# Patient Record
Sex: Female | Born: 1940 | Race: White | Hispanic: No | Marital: Married | Smoking: Current every day smoker
Health system: Southern US, Community
[De-identification: ages and names within clinical notes are randomized; demographics above are authoritative.]

## PROBLEM LIST (undated history)

## (undated) DIAGNOSIS — Z789 Other specified health status: Secondary | ICD-10-CM

## (undated) DIAGNOSIS — J40 Bronchitis, not specified as acute or chronic: Secondary | ICD-10-CM

## (undated) DIAGNOSIS — I499 Cardiac arrhythmia, unspecified: Secondary | ICD-10-CM

## (undated) DIAGNOSIS — T4145XA Adverse effect of unspecified anesthetic, initial encounter: Secondary | ICD-10-CM

## (undated) DIAGNOSIS — H547 Unspecified visual loss: Secondary | ICD-10-CM

## (undated) DIAGNOSIS — M1990 Unspecified osteoarthritis, unspecified site: Secondary | ICD-10-CM

## (undated) DIAGNOSIS — I639 Cerebral infarction, unspecified: Secondary | ICD-10-CM

## (undated) DIAGNOSIS — F419 Anxiety disorder, unspecified: Secondary | ICD-10-CM

## (undated) DIAGNOSIS — I10 Essential (primary) hypertension: Secondary | ICD-10-CM

## (undated) DIAGNOSIS — I70201 Unspecified atherosclerosis of native arteries of extremities, right leg: Secondary | ICD-10-CM

## (undated) HISTORY — PX: TUBAL LIGATION: SHX77

## (undated) HISTORY — DX: Other specified health status: Z78.9

## (undated) HISTORY — PX: THYROIDECTOMY, PARTIAL: SHX18

## (undated) HISTORY — PX: THYROID SURGERY: SHX805

---

## 1983-09-17 DIAGNOSIS — T8859XA Other complications of anesthesia, initial encounter: Secondary | ICD-10-CM

## 1983-09-17 HISTORY — DX: Other complications of anesthesia, initial encounter: T88.59XA

## 2019-01-12 ENCOUNTER — Ambulatory Visit (INDEPENDENT_AMBULATORY_CARE_PROVIDER_SITE_OTHER): Payer: Self-pay

## 2019-01-12 ENCOUNTER — Encounter (INDEPENDENT_AMBULATORY_CARE_PROVIDER_SITE_OTHER): Payer: Self-pay

## 2019-01-12 ENCOUNTER — Encounter (INDEPENDENT_AMBULATORY_CARE_PROVIDER_SITE_OTHER): Payer: Self-pay | Admitting: Vascular Surgery

## 2019-01-12 ENCOUNTER — Other Ambulatory Visit (INDEPENDENT_AMBULATORY_CARE_PROVIDER_SITE_OTHER): Payer: Self-pay | Admitting: Vascular Surgery

## 2019-01-12 ENCOUNTER — Ambulatory Visit (INDEPENDENT_AMBULATORY_CARE_PROVIDER_SITE_OTHER): Payer: Self-pay | Admitting: Vascular Surgery

## 2019-01-12 ENCOUNTER — Other Ambulatory Visit: Payer: Self-pay

## 2019-01-12 ENCOUNTER — Telehealth (INDEPENDENT_AMBULATORY_CARE_PROVIDER_SITE_OTHER): Payer: Self-pay

## 2019-01-12 VITALS — BP 159/91 | HR 90 | Resp 16 | Ht 63.0 in | Wt 172.0 lb

## 2019-01-12 DIAGNOSIS — I70269 Atherosclerosis of native arteries of extremities with gangrene, unspecified extremity: Secondary | ICD-10-CM | POA: Insufficient documentation

## 2019-01-12 DIAGNOSIS — I739 Peripheral vascular disease, unspecified: Secondary | ICD-10-CM

## 2019-01-12 DIAGNOSIS — R030 Elevated blood-pressure reading, without diagnosis of hypertension: Secondary | ICD-10-CM

## 2019-01-12 DIAGNOSIS — F17200 Nicotine dependence, unspecified, uncomplicated: Secondary | ICD-10-CM | POA: Insufficient documentation

## 2019-01-12 DIAGNOSIS — I70261 Atherosclerosis of native arteries of extremities with gangrene, right leg: Secondary | ICD-10-CM

## 2019-01-12 NOTE — Assessment & Plan Note (Signed)
Her blood pressure is significantly elevated today.  There may be a component of pain causing some of the rise in blood pressure.  I suspect she has some underlying hypertension which is undiagnosed as she has not seen a doctor in many years.

## 2019-01-12 NOTE — Assessment & Plan Note (Signed)
The patient has advanced gangrenous changes to the forefoot on the right foot.  Noninvasive studies today demonstrate a markedly reduced right ABI of 0.52 with monophasic waveforms.  Her left ABI is mildly diminished at 0.90 with monophasic waveforms. This is clearly a critical and limb threatening situation.  I am going to go ahead and start her on some antibiotics and give her some pain pills as this is clearly very painful.  At this point, realistically I think our best hope would be a transmetatarsal amputation but she is at very high risk of major more proximal amputation.  She has extensive vascular disease and revascularization will be required for any hope of limb salvage.  I discussed with she and her husband and we will schedule an angiogram in the very near future at their convenience.  I have discussed the risks and benefits of that procedure and the reason and rationale for performing it.  They are agreeable to proceed.

## 2019-01-12 NOTE — Assessment & Plan Note (Signed)
This is an underlying risk for vascular disease.  No interest in quitting.

## 2019-01-12 NOTE — Patient Instructions (Signed)

## 2019-01-12 NOTE — Progress Notes (Signed)
Patient ID: Krystal Herrera, female   DOB: 28-Jun-1941, 78 y.o.   MRN: [id number]  Chief Complaint  Patient presents with  . New Patient (Initial Visit)    HPI Krystal Herrera is a 78 y.o. female.  Patient present for evaluation of pain and skin changes to the right foot.  Over the past several weeks, the patient has had increasing pain and skin breakdown on her right forefoot.  Her first 3 toes on the right foot are black and clearly gangrenous.  The fourth and fifth toes have some early gangrenous changes as well.  The top of the foot has dark eschar and some fibrinous exudate up to the metatarsal head area.  The plantar aspect of the foot appears viable to near the toe bases.  The foot is swollen.  There is a significant odor.  The wound has some mild surrounding erythema.  The left leg is swollen but there are no open ulcerations on that leg.  The left leg is not nearly as painful.  She has not been to a doctor in over 20 years so she does not have a lot of medical history although she is legally blind and her husband says she has had some strokes as well.  He has been caring for her.  Noninvasive studies today demonstrate a markedly reduced right ABI of 0.52 with monophasic waveforms.  Her left ABI is mildly diminished at 0.90 with monophasic waveforms.  Current Outpatient Medications  Medication Sig Dispense Refill  . aspirin EC 81 MG tablet Take 81 mg by mouth daily.     No current facility-administered medications for this visit.      PMH Patient has not gone to a doctor in over 20 years. Her husband reports that she has likely had some small strokes.  She is legally blind.  Family History No bleeding disorders, clotting disorders, autoimmune diseases, or aneurysms  Social History Married Retired Positive for tobacco use No illicit drug use  NKDA     REVIEW OF SYSTEMS (Negative unless checked)  Constitutional: [] Weight loss  [] Fever  [] Chills Cardiac: [] Chest pain    [] Chest pressure   [] Palpitations   [] Shortness of breath when laying flat   [] Shortness of breath at rest   [] Shortness of breath with exertion. Vascular:  [x] Pain in legs with walking   [] Pain in legs at rest   [] Pain in legs when laying flat   [] Claudication   [x] Pain in feet when walking  [x] Pain in feet at rest  [] Pain in feet when laying flat   [] History of DVT   [] Phlebitis   [] Swelling in legs   [] Varicose veins   [x] Non-healing ulcers Pulmonary:   [] Uses home oxygen   [] Productive cough   [] Hemoptysis   [] Wheeze  [] COPD   [] Asthma Neurologic:  [] Dizziness  [] Blackouts   [] Seizures   [x] History of stroke   [] History of TIA  [] Aphasia   [] Temporary blindness   [] Dysphagia   [] Weakness or numbness in arms   [] Weakness or numbness in legs Musculoskeletal:  [] Arthritis   [] Joint swelling   [] Joint pain   [] Low back pain Hematologic:  [] Easy bruising  [] Easy bleeding   [] Hypercoagulable state   [] Anemic  [] Hepatitis  Gastrointestinal:  [] Blood in stool   [] Vomiting blood  [] Gastroesophageal reflux/heartburn   [] Difficulty swallowing   Genitourinary:  [] Chronic kidney disease   [] Difficult urination  [] Frequent urination  [] Burning with urination   [] Blood in urine Skin:  [] Rashes   [x] Ulcers   [  x]Wounds Psychological:  [] History of anxiety   []  History of major depression.  Physical Exam BP (!) 159/91 (BP Location: Right Arm)   Pulse 90   Resp 16   Ht 5\' 3"  (1.6 m)   Wt 172 lb (78 kg)   BMI 30.47 kg/m   Gen:  WD/WN, NAD Head: [place]/AT, No temporalis wasting.  Ear/Nose/Throat: Hearing grossly intact, nares w/o erythema or drainage, oropharynx w/o Erythema/Exudate Eyes: Sclera non-icteric, conjunctiva clear. Visual acuity very poor. Neck: Trachea midline.  No JVD.  Pulmonary:  Good air movement, no use of accessory muscles.  Cardiac: tachycardic Vascular:  Vessel Right Left  Radial Palpable Palpable                          PT Not Palpable Not Palpable  DP Not Palpable 1+ Palpable     Musculoskeletal: M/S 5/5 throughout.  1-2+ right lower extremity edema.  2+ left lower extremity edema.  Right forefoot with gangrenous changes as below. Neurologic:  Sensation grossly intact in extremities.  Symmetrical.  Speech is fluent. Motor exam as listed above. Psychiatric: Judgment intact, Mood & affect appropriate for pt's clinical situation. Dermatologic: Right foot wound with gangrenous changes to the toes worse in toes 1 2 and 3.  Gangrenous and necrotic changes extend to the top of the foot with mild to moderate surrounding erythema.  The wound is somewhat odorous.  No left leg wounds.  Radiology No results found.  Labs No results found for this or any previous visit (from the past 2160 hour(s)).  Assessment/Plan:  Tobacco use disorder This is an underlying risk for vascular disease.  No interest in quitting.  Elevated BP without diagnosis of hypertension Her blood pressure is significantly elevated today.  There may be a component of pain causing some of the rise in blood pressure.  I suspect she has some underlying hypertension which is undiagnosed as she has not seen a doctor in many years.  Atherosclerosis of native arteries of the extremities with gangrene [facility](HCC) The patient has advanced gangrenous changes to the forefoot on the right foot.  Noninvasive studies today demonstrate a markedly reduced right ABI of 0.52 with monophasic waveforms.  Her left ABI is mildly diminished at 0.90 with monophasic waveforms. This is clearly a critical and limb threatening situation.  I am going to go ahead and start her on some antibiotics and give her some pain pills as this is clearly very painful.  At this point, realistically I think our best hope would be a transmetatarsal amputation but she is at very high risk of major more proximal amputation.  She has extensive vascular disease and revascularization will be required for any hope of limb salvage.  I discussed with she and her  husband and we will schedule an angiogram in the very near future at their convenience.  I have discussed the risks and benefits of that procedure and the reason and rationale for performing it.  They are agreeable to proceed.     Festus BarrenJason Daphnee Preiss 01/12/2019, 1:04 PM   This note was created with [facility] medical dictation system.  Any errors from dictation are unintentional.

## 2019-01-12 NOTE — Telephone Encounter (Signed)
Attempted to contact the patient to schedule a right leg angio, a message was left for a return call.

## 2019-01-21 ENCOUNTER — Ambulatory Visit: Payer: Medicare Other

## 2019-01-21 ENCOUNTER — Other Ambulatory Visit (INDEPENDENT_AMBULATORY_CARE_PROVIDER_SITE_OTHER): Payer: Self-pay | Admitting: Vascular Surgery

## 2019-01-21 ENCOUNTER — Ambulatory Visit (HOSPITAL_COMMUNITY)
Admission: RE | Admit: 2019-01-21 | Discharge: 2019-01-21 | Disposition: A | Discharge status: Home / Self Care | Payer: Medicare Other | Source: Home / Self Care | Attending: Physician Assistant | Admitting: Physician Assistant

## 2019-01-21 ENCOUNTER — Other Ambulatory Visit: Payer: Self-pay

## 2019-01-21 ENCOUNTER — Inpatient Hospital Stay
Admission: RE | Admit: 2019-01-21 | Discharge: 2019-01-23 | DRG: 253 | Disposition: A | Discharge status: Home Health | Payer: Medicare Other | Attending: Family Medicine | Admitting: Family Medicine

## 2019-01-21 DIAGNOSIS — I701 Atherosclerosis of renal artery: Secondary | ICD-10-CM | POA: Diagnosis present

## 2019-01-21 DIAGNOSIS — F17210 Nicotine dependence, cigarettes, uncomplicated: Secondary | ICD-10-CM | POA: Diagnosis present

## 2019-01-21 DIAGNOSIS — I499 Cardiac arrhythmia, unspecified: Secondary | ICD-10-CM

## 2019-01-21 DIAGNOSIS — J449 Chronic obstructive pulmonary disease, unspecified: Secondary | ICD-10-CM | POA: Diagnosis present

## 2019-01-21 DIAGNOSIS — I10 Essential (primary) hypertension: Secondary | ICD-10-CM | POA: Diagnosis present

## 2019-01-21 DIAGNOSIS — Z1159 Encounter for screening for other viral diseases: Secondary | ICD-10-CM

## 2019-01-21 DIAGNOSIS — I480 Paroxysmal atrial fibrillation: Secondary | ICD-10-CM | POA: Diagnosis present

## 2019-01-21 DIAGNOSIS — L97919 Non-pressure chronic ulcer of unspecified part of right lower leg with unspecified severity: Secondary | ICD-10-CM | POA: Diagnosis present

## 2019-01-21 DIAGNOSIS — Z8249 Family history of ischemic heart disease and other diseases of the circulatory system: Secondary | ICD-10-CM | POA: Diagnosis not present

## 2019-01-21 DIAGNOSIS — Z8673 Personal history of transient ischemic attack (TIA), and cerebral infarction without residual deficits: Secondary | ICD-10-CM | POA: Diagnosis not present

## 2019-01-21 DIAGNOSIS — H548 Legal blindness, as defined in USA: Secondary | ICD-10-CM | POA: Diagnosis present

## 2019-01-21 DIAGNOSIS — Z885 Allergy status to narcotic agent status: Secondary | ICD-10-CM | POA: Diagnosis not present

## 2019-01-21 DIAGNOSIS — R296 Repeated falls: Secondary | ICD-10-CM | POA: Diagnosis present

## 2019-01-21 DIAGNOSIS — Z66 Do not resuscitate: Secondary | ICD-10-CM | POA: Diagnosis present

## 2019-01-21 DIAGNOSIS — I70201 Unspecified atherosclerosis of native arteries of extremities, right leg: Secondary | ICD-10-CM | POA: Diagnosis present

## 2019-01-21 DIAGNOSIS — Z7982 Long term (current) use of aspirin: Secondary | ICD-10-CM | POA: Diagnosis not present

## 2019-01-21 DIAGNOSIS — I70269 Atherosclerosis of native arteries of extremities with gangrene, unspecified extremity: Secondary | ICD-10-CM

## 2019-01-21 DIAGNOSIS — I340 Nonrheumatic mitral (valve) insufficiency: Secondary | ICD-10-CM

## 2019-01-21 DIAGNOSIS — I4891 Unspecified atrial fibrillation: Secondary | ICD-10-CM | POA: Diagnosis present

## 2019-01-21 DIAGNOSIS — I96 Gangrene, not elsewhere classified: Secondary | ICD-10-CM | POA: Diagnosis present

## 2019-01-21 HISTORY — DX: Essential (primary) hypertension: I10

## 2019-01-21 HISTORY — DX: Adverse effect of unspecified anesthetic, initial encounter: T41.45XA

## 2019-01-21 HISTORY — DX: Cardiac arrhythmia, unspecified: I49.9

## 2019-01-21 HISTORY — DX: Cerebral infarction, unspecified: I63.9

## 2019-01-21 LAB — ECHOCARDIOGRAM COMPLETE
Height: 63 in
Weight: 2751.34 oz

## 2019-01-21 LAB — CBC
HCT: 36.3 % (ref 36.0–46.0)
Hemoglobin: 11.5 g/dL — ABNORMAL LOW (ref 12.0–15.0)
MCH: 31.3 pg (ref 26.0–34.0)
MCHC: 31.7 g/dL (ref 30.0–36.0)
MCV: 98.6 fL (ref 80.0–100.0)
Platelets: 364 10*3/uL (ref 150–400)
RBC: 3.68 MIL/uL — ABNORMAL LOW (ref 3.87–5.11)
RDW: 15.2 % (ref 11.5–15.5)
WBC: 11.4 10*3/uL — ABNORMAL HIGH (ref 4.0–10.5)
nRBC: 0 % (ref 0.0–0.2)

## 2019-01-21 LAB — BASIC METABOLIC PANEL
Anion gap: 14 (ref 5–15)
BUN: 10 mg/dL (ref 8–23)
CO2: 22 mmol/L (ref 22–32)
Calcium: 8.6 mg/dL — ABNORMAL LOW (ref 8.9–10.3)
Chloride: 105 mmol/L (ref 98–111)
Creatinine, Ser: 0.71 mg/dL (ref 0.44–1.00)
GFR calc Af Amer: >60 mL/min (ref >60)
GFR calc non Af Amer: >60 mL/min (ref >60)
Glucose, Bld: 121 mg/dL — ABNORMAL HIGH (ref 70–99)
Potassium: 4.1 mmol/L (ref 3.5–5.1)
Sodium: 141 mmol/L (ref 135–145)

## 2019-01-21 LAB — BUN: BUN: 11 mg/dL (ref 8–23)

## 2019-01-21 LAB — CREATININE, SERUM
Creatinine, Ser: 0.81 mg/dL (ref 0.44–1.00)
GFR calc Af Amer: >60 mL/min (ref >60)
GFR calc non Af Amer: >60 mL/min (ref >60)

## 2019-01-21 LAB — TSH: TSH: 7.459 u[IU]/mL — ABNORMAL HIGH (ref 0.350–4.500)

## 2019-01-21 LAB — HEMOGLOBIN A1C
Hgb A1c MFr Bld: 5.6 % (ref 4.8–5.6)
Mean Plasma Glucose: 114.02 mg/dL

## 2019-01-21 LAB — TROPONIN I
Troponin I: <0.03 ng/mL (ref <0.03)
Troponin I: <0.03 ng/mL (ref <0.03)
Troponin I: <0.03 ng/mL (ref <0.03)
Troponin I: <0.03 ng/mL (ref <0.03)

## 2019-01-21 LAB — SARS CORONAVIRUS 2 BY RT PCR (HOSPITAL ORDER, PERFORMED IN ~~LOC~~ HOSPITAL LAB): SARS Coronavirus 2: NEGATIVE

## 2019-01-21 LAB — MAGNESIUM: Magnesium: 1.6 mg/dL — ABNORMAL LOW (ref 1.7–2.4)

## 2019-01-21 LAB — HEPARIN LEVEL (UNFRACTIONATED): Heparin Unfractionated: 0.29 IU/mL — ABNORMAL LOW (ref 0.30–0.70)

## 2019-01-21 MED ORDER — HEPARIN (PORCINE) 25000 UT/250ML-% IV SOLN
INTRAVENOUS | Status: AC
  Administered 2019-01-21: 3400 [IU] via INTRAVENOUS
  Filled 2019-01-21: qty 250

## 2019-01-21 MED ORDER — SODIUM CHLORIDE 0.9 % IV SOLN
INTRAVENOUS | Status: DC
  Administered 2019-01-22: 02:00:00 via INTRAVENOUS

## 2019-01-21 MED ORDER — ONDANSETRON HCL 4 MG/2ML IJ SOLN: 4.0000 mg | Freq: Four times a day (QID) | INTRAMUSCULAR | Status: DC | PRN

## 2019-01-21 MED ORDER — KETOROLAC TROMETHAMINE 30 MG/ML IJ SOLN
30.0000 mg | Freq: Once | INTRAMUSCULAR | Status: AC
  Filled 2019-01-21: qty 1

## 2019-01-21 MED ORDER — DILTIAZEM HCL 100 MG IV SOLR
5.0000 mg/h | INTRAVENOUS | Status: DC
  Administered 2019-01-21: 15 mg/h via INTRAVENOUS
  Administered 2019-01-21: 5 mg/h via INTRAVENOUS
  Administered 2019-01-22 (×3): 12.5 mg/h via INTRAVENOUS
  Administered 2019-01-22: 10 mg/h via INTRAVENOUS
  Filled 2019-01-21 (×5): qty 100

## 2019-01-21 MED ORDER — HYDRALAZINE HCL 20 MG/ML IJ SOLN: 10.0000 mg | Freq: Four times a day (QID) | INTRAMUSCULAR | Status: DC | PRN

## 2019-01-21 MED ORDER — METHYLPREDNISOLONE SODIUM SUCC 125 MG IJ SOLR: 125.0000 mg | Freq: Once | INTRAMUSCULAR | Status: DC | PRN

## 2019-01-21 MED ORDER — ACETAMINOPHEN 325 MG PO TABS: 650.0000 mg | ORAL_TABLET | Freq: Four times a day (QID) | ORAL | Status: DC | PRN

## 2019-01-21 MED ORDER — DILTIAZEM LOAD VIA INFUSION
10.0000 mg | Freq: Once | INTRAVENOUS | Status: DC
  Filled 2019-01-21: qty 10

## 2019-01-21 MED ORDER — DILTIAZEM HCL-DEXTROSE 100-5 MG/100ML-% IV SOLN (PREMIX)
5.0000 mg/h | INTRAVENOUS | Status: DC
  Filled 2019-01-21: qty 100

## 2019-01-21 MED ORDER — DILTIAZEM LOAD VIA INFUSION
10.0000 mg | Freq: Once | INTRAVENOUS | Status: AC
  Administered 2019-01-21: 10 mg via INTRAVENOUS
  Filled 2019-01-21: qty 10

## 2019-01-21 MED ORDER — SODIUM CHLORIDE 0.9 % IV SOLN
INTRAVENOUS | Status: DC
  Administered 2019-01-21: 1000 mL via INTRAVENOUS

## 2019-01-21 MED ORDER — TRAMADOL HCL 50 MG PO TABS
50.0000 mg | ORAL_TABLET | Freq: Four times a day (QID) | ORAL | Status: DC | PRN
  Administered 2019-01-21 – 2019-01-22 (×3): 50 mg via ORAL
  Filled 2019-01-21 (×2): qty 1

## 2019-01-21 MED ORDER — DILTIAZEM LOAD VIA INFUSION
10.0000 mg | Freq: Once | INTRAVENOUS | Status: DC
  Filled 2019-01-21 (×2): qty 10

## 2019-01-21 MED ORDER — NICOTINE 21 MG/24HR TD PT24
21.0000 mg | MEDICATED_PATCH | Freq: Every day | TRANSDERMAL | Status: DC
  Filled 2019-01-21: qty 1

## 2019-01-21 MED ORDER — ONDANSETRON HCL 4 MG PO TABS: 4.0000 mg | ORAL_TABLET | Freq: Four times a day (QID) | ORAL | Status: DC | PRN

## 2019-01-21 MED ORDER — HYDROMORPHONE HCL 1 MG/ML IJ SOLN: 1.0000 mg | Freq: Once | INTRAMUSCULAR | Status: DC | PRN

## 2019-01-21 MED ORDER — MAGNESIUM SULFATE 2 GM/50ML IV SOLN
2.0000 g | Freq: Once | INTRAVENOUS | Status: AC
  Administered 2019-01-21: 2 g via INTRAVENOUS
  Filled 2019-01-21: qty 50

## 2019-01-21 MED ORDER — FAMOTIDINE 20 MG PO TABS: 40.0000 mg | ORAL_TABLET | Freq: Once | ORAL | Status: DC | PRN

## 2019-01-21 MED ORDER — DIPHENHYDRAMINE HCL 50 MG/ML IJ SOLN: 50.0000 mg | Freq: Once | INTRAMUSCULAR | Status: DC | PRN

## 2019-01-21 MED ORDER — CEFAZOLIN SODIUM-DEXTROSE 2-4 GM/100ML-% IV SOLN
2.0000 g | Freq: Once | INTRAVENOUS | Status: AC
  Administered 2019-01-22: 2 g via INTRAVENOUS

## 2019-01-21 MED ORDER — CEFAZOLIN SODIUM-DEXTROSE 2-4 GM/100ML-% IV SOLN
INTRAVENOUS | Status: AC
  Administered 2019-01-21: 2 g via INTRAVENOUS
  Filled 2019-01-21: qty 100

## 2019-01-21 MED ORDER — MIDAZOLAM HCL 2 MG/ML PO SYRP: 8.0000 mg | ORAL_SOLUTION | Freq: Once | ORAL | Status: DC | PRN

## 2019-01-21 MED ORDER — HEPARIN (PORCINE) 25000 UT/250ML-% IV SOLN
1050.0000 [IU]/h | INTRAVENOUS | Status: DC
  Administered 2019-01-21: 950 [IU]/h via INTRAVENOUS

## 2019-01-21 MED ORDER — HEPARIN BOLUS VIA INFUSION
3400.0000 [IU] | Freq: Once | INTRAVENOUS | Status: AC
  Administered 2019-01-21: 15:00:00 3400 [IU] via INTRAVENOUS

## 2019-01-21 MED ORDER — KETOROLAC TROMETHAMINE 60 MG/2ML IM SOLN
INTRAMUSCULAR | Status: AC
  Administered 2019-01-21: 30 mg via INTRAVENOUS
  Filled 2019-01-21: qty 2

## 2019-01-21 MED ORDER — ACETAMINOPHEN 650 MG RE SUPP: 650.0000 mg | Freq: Four times a day (QID) | RECTAL | Status: DC | PRN

## 2019-01-21 NOTE — Consult Note (Signed)
ANTICOAGULATION CONSULT NOTE - Initial Consult  Pharmacy Consult for Heparin  Indication: atrial fibrillation  Allergies  Allergen Reactions  . Hydrocodone-Acetaminophen Swelling    Itching to arms and swelling to face and lips, improved with benadryl.    Patient Measurements: Height: 5\' 3"  (160 cm) Weight: 171 lb 15.3 oz (78 kg) IBW/kg (Calculated) : 52.4 Heparin Dosing Weight: 69.2 kg  Vital Signs: Temp: 97.8 F (36.6 C) (05/07 1158) Temp Source: Oral (05/07 1158) BP: 169/106 (05/07 1158) Pulse Rate: 167 (05/07 1158)  Labs: Recent Labs    01/21/19 1134 01/21/19 1203  HGB  --  11.5*  HCT  --  36.3  PLT  --  364  CREATININE 0.81 0.71  TROPONINI  --  <0.03    Estimated Creatinine Clearance: 58.2 mL/min (by C-G formula based on SCr of 0.71 mg/dL).   Medical History: Past Medical History:  Diagnosis Date  . Complication of anesthesia 1985   bp, hr and breathing bottomed out during surgery  . Dysrhythmia 01/21/2019  . Hypertension   . No pertinent past medical history    patient stated that she has not seen a doctor in 20 years  . Stroke [facility])     Medications:  Medications Prior to Admission  Medication Sig Dispense Refill Last Dose  . aspirin EC 81 MG tablet Take 81 mg by mouth daily.   Taking   Scheduled:   Infusions:  . sodium chloride 1,000 mL (01/21/19 1224)  . ceFAZolin    .  ceFAZolin (ANCEF) IV    . diltiazem (CARDIZEM) infusion 5 mg/hr (01/21/19 1331)  . magnesium sulfate bolus IVPB     PRN: diphenhydrAMINE, famotidine, HYDROmorphone (DILAUDID) injection, methylPREDNISolone (SOLU-MEDROL) injection, midazolam, ondansetron (ZOFRAN) IV Anti-infectives (From admission, onward)   Start     Dose/Rate Route Frequency Ordered Stop   01/21/19 1115  ceFAZolin (ANCEF) IVPB 2g/100 mL premix     2 g 200 mL/hr over 30 Minutes Intravenous  Once 01/21/19 1106     01/21/19 1111  ceFAZolin (ANCEF) 2-4 GM/100ML-% IVPB    Note to Pharmacy:  Littie Deeds   : cabinet override      01/21/19 1111 01/21/19 2314      Assessment: Pharmacy was consulted to start heparin for afib. Not on PTA anticoagulation. CHA2DS2VASc score of at least 7 (HTN, agex2, strokex2, vascular, female) and pending labs and echo for further risk stratification.   Goal of Therapy:  Heparin level 0.3-0.7 units/ml Monitor platelets by anticoagulation protocol: Yes   Plan:  Give 3400 units bolus x 1 Start heparin infusion at 950 units/hr Check anti-Xa level in 8 hours and daily while on heparin Continue to monitor H&H and platelets  Ronnald Ramp, PharmD, BCPS 01/21/2019,1:45 PM

## 2019-01-21 NOTE — H&P (Signed)
[facility] History & Physical Update  The patient was interviewed and re-examined.  The patient's previous History and Physical has been reviewed. She is in atrial fibrillation with RVR and a HR of 150.  Will ask Cardiology to see before considering proceeding with the procedure.   Festus Barren, MD  01/21/2019, 11:28 AM

## 2019-01-21 NOTE — Progress Notes (Signed)
*  PRELIMINARY RESULTS* Echocardiogram 2D Echocardiogram has been performed.  Cristela Blue 01/21/2019, 1:39 PM

## 2019-01-21 NOTE — Consult Note (Signed)
ANTICOAGULATION CONSULT NOTE - Initial Consult  Pharmacy Consult for Heparin  Indication: atrial fibrillation  Allergies  Allergen Reactions  . Hydrocodone-Acetaminophen Swelling    Itching to arms and swelling to face and lips, improved with benadryl.    Patient Measurements: Height: 5\' 4"  (162.6 cm) Weight: 180 lb 4.8 oz (81.8 kg) IBW/kg (Calculated) : 54.7 Heparin Dosing Weight: 69.2 kg  Vital Signs: Temp: 98.6 F (37 C) (05/07 2259) Temp Source: Oral (05/07 2259) BP: 119/73 (05/07 2330) Pulse Rate: 76 (05/07 2330)  Labs: Recent Labs    01/21/19 1134  01/21/19 1203 01/21/19 1556 01/21/19 1931 01/21/19 2314  HGB  --   --  11.5*  --   --   --   HCT  --   --  36.3  --   --   --   PLT  --   --  364  --   --   --   HEPARINUNFRC  --   --   --   --   --  0.29*  CREATININE 0.81  --  0.71  --   --   --   TROPONINI  --    < > <0.03 <0.03 <0.03 <0.03   < > = values in this interval not displayed.    Estimated Creatinine Clearance: 60.9 mL/min (by C-G formula based on SCr of 0.71 mg/dL).   Medical History: Past Medical History:  Diagnosis Date  . Complication of anesthesia 1985   bp, hr and breathing bottomed out during surgery  . Dysrhythmia 01/21/2019  . Hypertension   . No pertinent past medical history    patient stated that she has not seen a doctor in 20 years  . Stroke [facility])     Medications:  Medications Prior to Admission  Medication Sig Dispense Refill Last Dose  . aspirin EC 81 MG tablet Take 81 mg by mouth daily.   Taking   Scheduled:  . nicotine  21 mg Transdermal Daily   Infusions:  . [START ON 01/22/2019] sodium chloride    .  ceFAZolin (ANCEF) IV    . diltiazem (CARDIZEM) infusion 15 mg/hr (01/21/19 1850)  . heparin 950 Units/hr (01/21/19 1442)   PRN: acetaminophen **OR** acetaminophen, hydrALAZINE, HYDROmorphone (DILAUDID) injection, ondansetron (ZOFRAN) IV, ondansetron **OR** ondansetron (ZOFRAN) IV, traMADol Anti-infectives (From  admission, onward)   Start     Dose/Rate Route Frequency Ordered Stop   01/21/19 1115  ceFAZolin (ANCEF) IVPB 2g/100 mL premix     2 g 200 mL/hr over 30 Minutes Intravenous  Once 01/21/19 1106     01/21/19 1111  ceFAZolin (ANCEF) 2-4 GM/100ML-% IVPB    Note to Pharmacy:  Littie Deeds  : cabinet override      01/21/19 1111 01/21/19 1538      Assessment: Pharmacy was consulted to start heparin for afib. Not on PTA anticoagulation. CHA2DS2VASc score of at least 7 (HTN, agex2, strokex2, vascular, female) and pending labs and echo for further risk stratification.   Goal of Therapy:  Heparin level 0.3-0.7 units/ml Monitor platelets by anticoagulation protocol: Yes   Plan:  05/07 @ 2300 HL 0.29 borderline subtherapeutic. Will increase rate to 1050 units/hr and will recheck HL @ 0800, CBC stable will continue to monitor.  Thomasene Ripple, PharmD, BCPS 01/21/2019,11:43 PM

## 2019-01-21 NOTE — Consult Note (Signed)
Cardiology Consultation:   Patient ID: Krystal Herrera MRN: [id number]; DOB: Jan 27, 1941  Admit date: 01/21/2019 Date of Consult: 01/21/2019  Primary Care Provider: Patient, No Pcp Per Primary Cardiologist: New to [place], Dr. Mariah Milling Primary Electrophysiologist:  None   Patient Profile:   Krystal Herrera is a 78 y.o. 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 Wyn Quaker.  History of Present Illness:   Krystal Herrera 78 year old female with medical history as above.  No reported past cardiac history. Unfortunately, she is not a great historian and has demonstrated poor medical compliance in the past.  She has not seen a physician in over 20 years. Medical history includes a possible partial thyroidectomy in 1985, reported by the patient, and for which she may have taken Synthroid for but believes this was stopped per her physician recommendation many years ago.  TSH lab pending.  Per review of EMR, she a;sp has several documented elevated blood pressures without an official diagnosis of hypertension.  Patient is not aware of any history of hypertension.  She does smoke 1 pack daily with a long history of smoking and no desire to quit.  She also admits to daily alcohol use with 1 drink per day and including fireball and various  other mixed drinks / hard liquor. To her knowledge, she has never seen a cardiologist and denied being told she had an irregular heart rhythm or Afib in the past.  She is unaware of any family history of atrial fibrillation.  She does admit to frequent falls with the most recent fall reported this past Sunday 01/17/2019. She has not hit her head during any of these falls.  Per her husband, patient also has a past history of strokes, not documented in any available EMR at this time.  Patient does not endorse any neurologic deficit s/sx, and feels that the falls are due to lower extremity weakness rather than a possible h/o stroke, arrhythmia, or vision problems as she is legally blind.   On 01/21/2019, she presented to [facility] for lower extremity angiography of her  right lower extremity due to peripheral vascular disease and right LE gangrene with future possible revascularization of her right lower extremity/foot and possible transmetatarsal amputation or even more proximal amputation, given her extensive vascular disease.  Cardiology was consulted as the patient was found to be in Afib with RVR and rates up to 160s on telemetry prior to her procedure.  On examination, patient denied chest pain, palpitations, or feeling of racing heart rate while in atrial fibrillation.  She denied any shortness of breath but did note LE edema, as well as pain associated with RLE gangrene. She did not feel as if she had been in this rhythm in the past. She denied PTA medications for anticoagulation or heart rate control.  She reported that she was unable to provide any further information regarding current symptoms, as she can only focus on the pain  associated with the gangrene of her right lower extremity.   Past Medical History:  Diagnosis Date   Complication of anesthesia 1985   bp, hr and breathing bottomed out during surgery   Dysrhythmia 01/21/2019   Hypertension    No pertinent past medical history    patient stated  that she has not seen a doctor in 20 years   Stroke [facility])     No past cardiac medical history  Home Medications:  Prior to Admission medications   Medication Sig Start Date End Date Taking? Authorizing Provider  aspirin EC 81 MG tablet Take 81 mg by mouth daily.    [provider]    Inpatient Medications: Scheduled Meds:  diltiazem  10 mg Intravenous Once   Continuous Infusions:  sodium chloride     ceFAZolin      ceFAZolin (ANCEF) IV     PRN Meds: diphenhydrAMINE, famotidine, HYDROmorphone (DILAUDID) injection, methylPREDNISolone (SOLU-MEDROL) injection, midazolam, ondansetron (ZOFRAN) IV  Allergies:    Allergies  Allergen Reactions   Hydrocodone-Acetaminophen Swelling    Itching to arms and swelling to face and lips, improved with benadryl.    Social History:   Social History   Socioeconomic History   Marital status: Married    Spouse name: Not on file   Number of children: Not on file   Years of education: Not on file   Highest education level: Not on file  Occupational History   Not on file  Social Needs   Financial resource strain: Not on file   Food insecurity:    Worry: Not on file    Inability: Not on file   Transportation needs:    Medical: Not on file    Non-medical: Not on file  Tobacco Use   Smoking status: Current Every Day Smoker   Smokeless tobacco: Never Used  Substance and Sexual Activity   Alcohol use: Yes    Alcohol/week: 1.0 standard drinks    Types: 1 Standard drinks or equivalent per week    Comment: daily   Drug use: Never   Sexual activity: Not on file  Lifestyle   Physical activity:    Days per week: Not on file    Minutes per session: Not on file   Stress: Not on file  Relationships   Social connections:    Talks on phone: Not on file    Gets together: Not on file    Attends religious service: Not on file    Active member of club or organization: Not on file    Attends meetings of clubs or  organizations: Not on file    Relationship status: Not on file   Intimate partner violence:    Fear of current or ex partner: Not on file    Emotionally abused: Not on file    Physically abused: Not on file    Forced sexual activity: Not on file  Other Topics Concern   Not on file  Social History Narrative   Not on file    Family History:   No family history of heart disease or arrhythmia. Family History  Family history unknown: Yes     ROS:  Please see the history of present illness.  Review of Systems  Unable to perform ROS: Medical condition  Constitutional: Negative for malaise/fatigue.  Eyes:       Legally blind  Respiratory: Negative for cough, hemoptysis, shortness of breath and wheezing.   Cardiovascular: Negative for chest pain, palpitations, orthopnea and PND.  Genitourinary: Negative for hematuria.  Musculoskeletal: Positive for falls.       Stated her RLE pain from gangrene wound trumps her ability to provide accurate ROS  Neurological: Negative for dizziness, focal weakness, loss of consciousness and headaches.       Tremor noted on exam but not noted by patient on ROS  All other systems reviewed and are negative.   All other ROS reviewed and negative.     Physical Exam/Data:   Vitals:   01/21/19 1158  BP: (!) 169/106  Pulse: (!) 167  Resp: (!) 21  Temp: 97.8 F (36.6 C)  TempSrc: Oral  SpO2: 96%  Weight: 78 kg  Height: 5\' 3"  (1.6 m)   No intake or output data in the 24 hours ending 01/21/19 1212 Filed Weights   01/21/19 1158  Weight: 78 kg   Body mass index is 30.46 kg/m.  General: Obese female in no acute distress HEENT: normal Neck: no JVD Vascular: Right lower extremity wound/gangrene due to peripheral vascular disease Cardiac: IR IR; no murmur appreciated at auscultation Lungs:  clear to auscultation bilaterally, slight bilateral wheezing, no rhonchi or rales  Abd: obese, soft, nontender, no hepatomegaly  Ext: Slight bilateral lower  extremity edema in setting of gangrene of right lower extremity Musculoskeletal: Right lower extremity gangrene-BLE strength not assessed Skin: warm and dry, RLE gangrene  Neuro:  CNs 2-12 intact, no focal abnormalities noted Psych:  Normal affect   EKG:  The EKG was personally reviewed and demonstrates: Atrial fibrillation with rapid ventricular rate, 162bpm, Low voltage in the precordial leads,QTc 478, QRS 77  Telemetry:  Telemetry was personally reviewed and demonstrates:  IRIR, rates 150-160s while at rest  Relevant CV Studies: Pending echo  Laboratory Data:  Chemistry Recent Labs  Lab 01/21/19 1134  BUN 11  CREATININE 0.81  GFRNONAA >60  GFRAA >60    No results for input(s): PROT, ALBUMIN, AST, ALT, ALKPHOS, BILITOT in the last 168 hours. HematologyNo results for input(s): WBC, RBC, HGB, HCT, MCV, MCH, MCHC, RDW, PLT in the last 168 hours. Cardiac EnzymesNo results for input(s): TROPONINI in the last 168 hours. No results for input(s): TROPIPOC in the last 168 hours.  BNPNo results for input(s): BNP, PROBNP in the last 168 hours.  DDimer No results for input(s): DDIMER in the last 168 hours.  Radiology/Studies:  No results found.  Assessment and Plan:   Paroxysmal Atrial fibrillation with RVR -Asymptomatic.  EKG as above shows Afib with rapid ventricular rate prior to vascular procedure. Vascular procedure on hold and patient to be admitted. -Unclear if new in onset, and patient is not the best historian and asymptomatic when in this rhythm.  Given her vascular problems and that her husband has reported a history of stroke, it is possible that the patient has been in this rhythm before and that this is new in onset. -RVR with current rate ventricular rate into the 160s.  Continue to monitor. - Started on IV diltiazem with bolus of 10 mg.  Repeat bolus as needed as BP tolerates.  Recommend infusion of diltiazem for optimal heart rate control of under 100 bpm.  - Started on  heparin drip. Continue.  PTA medications included ASA 81 mg.  Continue. - Not on PTA anticoagulation. CHA2DS2VASc score of at least 7 (HTN, agex2, strokex2, vascular, female) and pending labs and echo for further risk stratification. - No plan for cardioversion at this time and given patient is not therapeutically anticoagulated. If does not convert  on diltiazem drip, could consider TEE/DCCV later in admission.  -Frequent falls, confirmed by patient with last fall 5/3.  Will need to consider her history of frequent falls with any initiation of anticoagulation in the future and to be reassessed before discharge. -Pending CBC, BMET, magnesium, TSH (with past reported partial thyroidectomy), A1C. - Further recommendations pending echo and labs.  Atherosclerosis with gangrene of the R lower extremity - Per vascular surgery.  Lower extremity angiography has been put on hold until tomorrow given Afib prior to procedure. -As above, continue ASA 81 mg daily.  Antibiotics and dilaudid as needed for management of pain and per IM/vascular surgery.  For questions or updates, please contact [facility] Please consult [email or web address] for contact info under     Signed, Lennon Alstrom, PA-C  01/21/2019 12:12 PM

## 2019-01-21 NOTE — H&P (Signed)
[employer] [facility] [place] [facility]    PATIENT NAME: Krystal Herrera    MR#:  [id number]  DATE OF BIRTH:  [birth year]  DATE OF ADMISSION:  01/21/2019  PRIMARY CARE PHYSICIAN: Patient, No Pcp Per   REQUESTING/REFERRING PHYSICIAN: Dr. Festus Barren  CHIEF COMPLAINT:  No chief complaint on file. Atrial fibrillation with rapid ventricular response.  HISTORY OF PRESENT ILLNESS:  Krystal Herrera  is a 78 y.o. female with past medical history of remote CVA, hypertension, ongoing tobacco abuse who presented to the hospital for elective right lower extremity angiogram given a extensive right lower extremity ulcer but preoperatively was noted to be in atrial fibrillation with rapid ventricular response.  Patient was clinically asymptomatic and denies any palpitations, chest pains, shortness of breath, nausea or vomiting or dizziness.  Patient's heart rates preoperatively were noted to be in 140s to 150s.  Patient was given pulse doses of IV Cardizem and has been placed on a Cardizem drip and is being directly admitted to the telemetry floor.  Hospitalist services were contacted for admission.  PAST MEDICAL HISTORY:   Past Medical History:  Diagnosis Date   Complication of anesthesia 1985   bp, hr and breathing bottomed out during surgery   Dysrhythmia 01/21/2019   Hypertension    No pertinent past medical history    patient stated that she has not seen a doctor in 20 years   Stroke [facility])     PAST SURGICAL HISTORY:   None  SOCIAL HISTORY:   Social History   Tobacco Use   Smoking status: Current Every Day Smoker    Packs/day: 1.00    Years: 40.00    Pack years: 40.00    Types: Cigarettes   Smokeless tobacco: Never Used  Substance Use Topics   Alcohol use: Yes    Alcohol/week: 1.0 standard drinks    Types: 1 Standard drinks or equivalent per week    Comment: daily    FAMILY HISTORY:   Family History  Problem Relation Age of Onset   Pneumonia Father     Heart failure Father     DRUG ALLERGIES:   Allergies  Allergen Reactions   Hydrocodone-Acetaminophen Swelling    Itching to arms and swelling to face and lips, improved with benadryl.    REVIEW OF SYSTEMS:   Review of Systems  Constitutional: Negative for chills, fever and weight loss.  HENT: Negative for congestion, nosebleeds and tinnitus.   Eyes: Negative for blurred vision, double vision and redness.  Respiratory: Negative for cough, hemoptysis, shortness of breath and wheezing.   Cardiovascular: Negative for chest pain, orthopnea, leg swelling and PND.  Gastrointestinal: Negative for abdominal pain, diarrhea, melena, nausea and vomiting.  Genitourinary: Negative for dysuria, hematuria and urgency.  Musculoskeletal: Negative for falls and joint pain.  Neurological: Negative for dizziness, tingling, sensory change, focal weakness, seizures, weakness and headaches.  Endo/Heme/Allergies: Negative for polydipsia. Does not bruise/bleed easily.  Psychiatric/Behavioral: Negative for depression and memory loss. The patient is not nervous/anxious.   All other systems reviewed and are negative.   MEDICATIONS AT HOME:   Prior to Admission medications   Medication Sig Start Date End Date Taking? Authorizing Provider  aspirin EC 81 MG tablet Take 81 mg by mouth daily.    [provider]      VITAL SIGNS:  Blood pressure (!) 169/106, pulse (!) 167, temperature 97.8 F (36.6 C), temperature source Oral, resp. rate (!) 21, height  (1.6 m), weight 78 kg,  SpO2 96 %.  PHYSICAL EXAMINATION:  Physical Exam  GENERAL:  78 y.o.-year-old patient lying in the bed in no acute distress.  EYES: Pupils equal, round, reactive to light and accommodation. No scleral icterus. Extraocular muscles intact.  HEENT: Head atraumatic, normocephalic. Oropharynx and nasopharynx clear. No oropharyngeal erythema, moist oral mucosa  NECK:  Supple, no jugular venous distention. No thyroid  enlargement, no tenderness.  LUNGS: Normal breath sounds bilaterally, no wheezing, rales, rhonchi. No use of accessory muscles of respiration.  CARDIOVASCULAR: S1, S2 RRR. No murmurs, rubs, gallops, clicks.  ABDOMEN: Soft, nontender, nondistended. Bowel sounds present. No organomegaly or mass.  EXTREMITIES: +2 pedal edema b/l, cyanosis, or clubbing. + 2 pedal & radial pulses b/l.  RLE ulcer as shown below.      NEUROLOGIC: Cranial nerves II through XII are intact. No focal Motor or sensory deficits appreciated b/l. Globally weak.  PSYCHIATRIC: The patient is alert and oriented x 3. Good affect.  SKIN: No obvious rash, lesion, or ulcer.   LABORATORY PANEL:   CBC Recent Labs  Lab 01/21/19 1203  WBC 11.4*  HGB 11.5*  HCT 36.3  PLT 364   ------------------------------------------------------------------------------------------------------------------  Chemistries  Recent Labs  Lab 01/21/19 1203  NA 141  K 4.1  CL 105  CO2 22  GLUCOSE 121*  BUN 10  CREATININE 0.71  CALCIUM 8.6*  MG 1.6*   ------------------------------------------------------------------------------------------------------------------  Cardiac Enzymes Recent Labs  Lab 01/21/19 1203  TROPONINI <0.03   ------------------------------------------------------------------------------------------------------------------  RADIOLOGY:  Dg Chest Port 1 View  Result Date: 01/21/2019 CLINICAL DATA:  Hypertension.  Atrial fibrillation. EXAM: PORTABLE CHEST 1 VIEW COMPARISON:  None. FINDINGS: 1256 hours. The lungs are clear without focal pneumonia, edema, pneumothorax or pleural effusion. Interstitial markings are diffusely coarsened with chronic features. Cardiopericardial silhouette is at upper limits of normal for size. The visualized bony structures of the thorax are intact. Telemetry leads overlie the chest. IMPRESSION: No active disease. Electronically Signed   By: Kennith CenterEric  Mansell M.D.   On: 01/21/2019 13:27      IMPRESSION AND PLAN:   78 year old female with past medical history of remote CVA, hypertension, ongoing tobacco abuse who presents to the hospital due to angiogram for a right lower extremity ulcer and noted to be in atrial fibrillation with rapid ventricular response.  1.  Atrial fibrillation with rapid ventricular response- new onset for the patient.  Patient is clinically asymptomatic with no palpitation shortness of breath or chest pain. - Patient will be admitted to telemetry floor, will start on a Cardizem drip.  Also place the patient on a heparin drip. - Cardiology has been consulted.  Will follow cardiac markers, check echocardiogram. - Check TSH.  2.  Right lower extremity ulcer- thought to be a vascular ulcer.  Patient was supposed to get a angiogram but due to A. fib it was canceled today. - Vascular Surgery planning on doing angiogram tomorrow once her heart rates are under better control.  Continue heparin drip for now. - Patient may need wound team consult.  3.  Essential hypertension- patient currently on no medications. -Continue Cardizem drip for now.  If blood pressure still high we will place the patient on IV hydralazine or some oral metoprolol.  4. Tobacco abuse - will place on Nicotine patch.   All the records are reviewed and case discussed with ED provider. Management plans discussed with the patient, family and they are in agreement.  CODE STATUS: DNR  TOTAL TIME TAKING CARE OF THIS PATIENT: 45  minutes.    Houston Siren M.D on 01/21/2019 at 1:45 PM  Between 7am to 6pm - Pager - [phone]  After 6pm go to [email or web address] - [email or web address]  [facility]  Office  [phone]  CC: Primary care physician; Patient, No Pcp Per

## 2019-01-22 ENCOUNTER — Encounter
Admission: RE | Disposition: A | Discharge status: Home Health | Payer: Self-pay | Source: Home / Self Care | Attending: Specialist

## 2019-01-22 ENCOUNTER — Encounter: Payer: Self-pay | Admitting: Vascular Surgery

## 2019-01-22 DIAGNOSIS — J432 Centrilobular emphysema: Secondary | ICD-10-CM

## 2019-01-22 DIAGNOSIS — I7092 Chronic total occlusion of artery of the extremities: Secondary | ICD-10-CM

## 2019-01-22 DIAGNOSIS — I70261 Atherosclerosis of native arteries of extremities with gangrene, right leg: Secondary | ICD-10-CM

## 2019-01-22 DIAGNOSIS — F17200 Nicotine dependence, unspecified, uncomplicated: Secondary | ICD-10-CM

## 2019-01-22 DIAGNOSIS — I739 Peripheral vascular disease, unspecified: Secondary | ICD-10-CM

## 2019-01-22 HISTORY — PX: LOWER EXTREMITY ANGIOGRAPHY: CATH118251

## 2019-01-22 LAB — CBC
HCT: 32.4 % — ABNORMAL LOW (ref 36.0–46.0)
Hemoglobin: 10.2 g/dL — ABNORMAL LOW (ref 12.0–15.0)
MCH: 31.6 pg (ref 26.0–34.0)
MCHC: 31.5 g/dL (ref 30.0–36.0)
MCV: 100.3 fL — ABNORMAL HIGH (ref 80.0–100.0)
Platelets: 326 10*3/uL (ref 150–400)
RBC: 3.23 MIL/uL — ABNORMAL LOW (ref 3.87–5.11)
RDW: 15.8 % — ABNORMAL HIGH (ref 11.5–15.5)
WBC: 10.1 10*3/uL (ref 4.0–10.5)
nRBC: 0 % (ref 0.0–0.2)

## 2019-01-22 LAB — BASIC METABOLIC PANEL
Anion gap: 10 (ref 5–15)
BUN: 18 mg/dL (ref 8–23)
CO2: 23 mmol/L (ref 22–32)
Calcium: 8.3 mg/dL — ABNORMAL LOW (ref 8.9–10.3)
Chloride: 106 mmol/L (ref 98–111)
Creatinine, Ser: 0.65 mg/dL (ref 0.44–1.00)
GFR calc Af Amer: >60 mL/min (ref >60)
GFR calc non Af Amer: >60 mL/min (ref >60)
Glucose, Bld: 129 mg/dL — ABNORMAL HIGH (ref 70–99)
Potassium: 3.9 mmol/L (ref 3.5–5.1)
Sodium: 139 mmol/L (ref 135–145)

## 2019-01-22 LAB — MAGNESIUM: Magnesium: 2 mg/dL (ref 1.7–2.4)

## 2019-01-22 SURGERY — LOWER EXTREMITY ANGIOGRAPHY
Anesthesia: Moderate Sedation | Laterality: Right

## 2019-01-22 MED ORDER — METHYLPREDNISOLONE SODIUM SUCC 125 MG IJ SOLR: 125.0000 mg | Freq: Once | INTRAMUSCULAR | Status: DC | PRN

## 2019-01-22 MED ORDER — DIPHENHYDRAMINE HCL 50 MG/ML IJ SOLN
INTRAMUSCULAR | Status: AC
  Administered 2019-01-22: 08:00:00
  Filled 2019-01-22: qty 1

## 2019-01-22 MED ORDER — DIPHENHYDRAMINE HCL 50 MG/ML IJ SOLN
INTRAMUSCULAR | Status: DC | PRN
  Administered 2019-01-22: 50 mg via INTRAVENOUS

## 2019-01-22 MED ORDER — FAMOTIDINE 20 MG PO TABS: 40.0000 mg | ORAL_TABLET | Freq: Once | ORAL | Status: DC | PRN

## 2019-01-22 MED ORDER — FENTANYL CITRATE (PF) 100 MCG/2ML IJ SOLN
INTRAMUSCULAR | Status: AC
  Administered 2019-01-22: 25 ug via INTRAVENOUS
  Filled 2019-01-22: qty 2

## 2019-01-22 MED ORDER — CLOPIDOGREL BISULFATE 75 MG PO TABS
75.0000 mg | ORAL_TABLET | Freq: Every day | ORAL | Status: DC
  Administered 2019-01-22 – 2019-01-23 (×2): 75 mg via ORAL
  Filled 2019-01-22 (×2): qty 1

## 2019-01-22 MED ORDER — MIDAZOLAM HCL 2 MG/ML PO SYRP
8.0000 mg | ORAL_SOLUTION | Freq: Once | ORAL | Status: DC | PRN
  Filled 2019-01-22: qty 4

## 2019-01-22 MED ORDER — HYDROMORPHONE HCL 1 MG/ML IJ SOLN: 1.0000 mg | Freq: Once | INTRAMUSCULAR | Status: DC | PRN

## 2019-01-22 MED ORDER — METHYLPREDNISOLONE SODIUM SUCC 125 MG IJ SOLR
INTRAMUSCULAR | Status: AC
  Filled 2019-01-22: qty 2

## 2019-01-22 MED ORDER — MIDAZOLAM HCL 5 MG/5ML IJ SOLN
INTRAMUSCULAR | Status: AC
  Filled 2019-01-22: qty 5

## 2019-01-22 MED ORDER — SODIUM CHLORIDE 0.9 % IV SOLN: INTRAVENOUS | Status: DC

## 2019-01-22 MED ORDER — DIPHENHYDRAMINE HCL 50 MG/ML IJ SOLN: 50.0000 mg | Freq: Once | INTRAMUSCULAR | Status: DC | PRN

## 2019-01-22 MED ORDER — IOHEXOL 300 MG/ML  SOLN
INTRAMUSCULAR | Status: DC | PRN
  Administered 2019-01-22: 60 mL via INTRAVENOUS

## 2019-01-22 MED ORDER — ASPIRIN EC 81 MG PO TBEC
81.0000 mg | DELAYED_RELEASE_TABLET | Freq: Every day | ORAL | Status: DC
  Administered 2019-01-22 – 2019-01-23 (×2): 81 mg via ORAL
  Filled 2019-01-22 (×2): qty 1

## 2019-01-22 MED ORDER — MIDAZOLAM HCL 5 MG/5ML IJ SOLN
INTRAMUSCULAR | Status: AC
  Administered 2019-01-22: 08:00:00
  Filled 2019-01-22: qty 5

## 2019-01-22 MED ORDER — MIDAZOLAM HCL 2 MG/2ML IJ SOLN
INTRAMUSCULAR | Status: DC | PRN
  Administered 2019-01-22: 1 mg via INTRAVENOUS
  Administered 2019-01-22 (×2): 2 mg via INTRAVENOUS

## 2019-01-22 MED ORDER — HEPARIN SODIUM (PORCINE) 1000 UNIT/ML IJ SOLN
INTRAMUSCULAR | Status: AC
  Filled 2019-01-22: qty 1

## 2019-01-22 MED ORDER — FENTANYL CITRATE (PF) 100 MCG/2ML IJ SOLN
INTRAMUSCULAR | Status: DC | PRN
  Administered 2019-01-22: 50 ug via INTRAVENOUS

## 2019-01-22 MED ORDER — TRAMADOL HCL 50 MG PO TABS
ORAL_TABLET | ORAL | Status: AC
  Administered 2019-01-22: 50 mg via ORAL
  Filled 2019-01-22: qty 1

## 2019-01-22 MED ORDER — FENTANYL CITRATE (PF) 100 MCG/2ML IJ SOLN
25.0000 ug | Freq: Once | INTRAMUSCULAR | Status: AC
  Administered 2019-01-22: 09:00:00 25 ug via INTRAVENOUS

## 2019-01-22 MED ORDER — HEPARIN SODIUM (PORCINE) 1000 UNIT/ML IJ SOLN
INTRAMUSCULAR | Status: DC | PRN
  Administered 2019-01-22: 5000 [IU] via INTRAVENOUS

## 2019-01-22 MED ORDER — HEPARIN (PORCINE) 25000 UT/250ML-% IV SOLN
1150.0000 [IU]/h | INTRAVENOUS | Status: DC
  Administered 2019-01-22 (×2): 1050 [IU]/h via INTRAVENOUS
  Filled 2019-01-22: qty 250

## 2019-01-22 MED ORDER — CEFAZOLIN SODIUM-DEXTROSE 2-4 GM/100ML-% IV SOLN
2.0000 g | Freq: Once | INTRAVENOUS | Status: DC
  Filled 2019-01-22: qty 100

## 2019-01-22 MED ORDER — LIDOCAINE-EPINEPHRINE (PF) 1 %-1:200000 IJ SOLN
INTRAMUSCULAR | Status: AC
  Administered 2019-01-22: 07:00:00
  Filled 2019-01-22: qty 30

## 2019-01-22 MED ORDER — ONDANSETRON HCL 4 MG/2ML IJ SOLN: 4.0000 mg | Freq: Four times a day (QID) | INTRAMUSCULAR | Status: DC | PRN

## 2019-01-22 MED ORDER — DILTIAZEM HCL 30 MG PO TABS
60.0000 mg | ORAL_TABLET | Freq: Four times a day (QID) | ORAL | Status: DC
  Administered 2019-01-22 – 2019-01-23 (×5): 60 mg via ORAL
  Filled 2019-01-22 (×5): qty 2

## 2019-01-22 MED ORDER — HEPARIN (PORCINE) IN NACL 1000-0.9 UT/500ML-% IV SOLN
INTRAVENOUS | Status: AC
  Filled 2019-01-22: qty 1000

## 2019-01-22 MED ORDER — ATORVASTATIN CALCIUM 10 MG PO TABS
10.0000 mg | ORAL_TABLET | Freq: Every day | ORAL | Status: DC
  Administered 2019-01-22: 10 mg via ORAL
  Filled 2019-01-22: qty 1

## 2019-01-22 MED ORDER — COLLAGENASE 250 UNIT/GM EX OINT
TOPICAL_OINTMENT | Freq: Every day | CUTANEOUS | Status: DC
  Administered 2019-01-22 – 2019-01-23 (×2): via TOPICAL
  Filled 2019-01-22 (×2): qty 30

## 2019-01-22 MED ORDER — FENTANYL CITRATE (PF) 100 MCG/2ML IJ SOLN
INTRAMUSCULAR | Status: AC
  Administered 2019-01-22: 08:00:00
  Filled 2019-01-22: qty 2

## 2019-01-22 SURGICAL SUPPLY — 20 items
BALLN LUTONIX 018 5X300X130 (BALLOONS) ×6
BALLN ULTRVRSE 4X300X150 (BALLOONS) ×3
BALLOON LUTONIX 018 5X300X130 (BALLOONS) ×2 IMPLANT
BALLOON ULTRVRSE 4X300X150 (BALLOONS) ×1 IMPLANT
CATH BEACON 5 .038 100 VERT TP (CATHETERS) ×3 IMPLANT
CATH CXI SUPP ANG 4FR 135 (CATHETERS) ×1 IMPLANT
CATH CXI SUPP ANG 4FR 135CM (CATHETERS) ×3
CATH PIG 70CM (CATHETERS) ×3 IMPLANT
DEVICE PRESTO INFLATION (MISCELLANEOUS) ×3 IMPLANT
DEVICE SAFEGUARD 24CM (GAUZE/BANDAGES/DRESSINGS) ×3 IMPLANT
DEVICE STARCLOSE SE CLOSURE (Vascular Products) ×3 IMPLANT
GLIDEWIRE ADV .035X260CM (WIRE) ×3 IMPLANT
PACK ANGIOGRAPHY (CUSTOM PROCEDURE TRAY) ×3 IMPLANT
SHEATH ANL2 6FRX45 HC (SHEATH) ×3 IMPLANT
SHEATH BRITE TIP 5FRX11 (SHEATH) ×3 IMPLANT
STENT VIABAHN 6X150X120 (Permanent Stent) ×3 IMPLANT
SYR MEDRAD MARK 7 150ML (SYRINGE) ×3 IMPLANT
TUBING CONTRAST HIGH PRESS 72 (TUBING) ×3 IMPLANT
WIRE G V18X300CM (WIRE) ×3 IMPLANT
WIRE J 3MM .035X145CM (WIRE) ×3 IMPLANT

## 2019-01-22 NOTE — Consult Note (Addendum)
WOC Nurse wound consult note Reason for Consult:Consult requested for right foot.  Pt is followed by Vascular team for gangrenous toes and full thickness chronic wound to right anterior foot.  Arteriogram performed today to attempt to increase perfusion.  WOC team requested to provide topical treatment recommendations.  Reviewed progress notes and photos in the EMR.  Wound type: Right toes are dry intact eschar. Right anterior foot with full thickness wound; 100% yellow slough. Dressing procedure/placement/frequency: Right toes are dry intact eschar.  Topical treatment will be minimally effective to promote healing.  Xeroform gauze to protect from further injury. Float heels to reduce pressure.  Santyl ointment to right anterior foot wound to provide enzymatic debridement. Pt could benefit from sharp debridement at a later time. Please refer to vascular team for further questions. Please re-consult if further assistance is needed.  Thank-you,  Cammie Mcgee MSN, RN, CWOCN, [place], CNS [phone]

## 2019-01-22 NOTE — Progress Notes (Signed)
Talked to Krystal Merl NP about patient's HR going up to 120's-140's Afib non sustaining, asymptomatic, BP is 124/83. Order to monitor for now and to notify NP if HR is sustaining to 120 then will give PRN order. RN will continue to monitor.

## 2019-01-22 NOTE — Consult Note (Addendum)
ANTICOAGULATION CONSULT NOTE - Initial Consult  Pharmacy Consult for Heparin  Indication: atrial fibrillation  Allergies  Allergen Reactions  . Hydrocodone-Acetaminophen Swelling    Itching to arms and swelling to face and lips, improved with benadryl.    Patient Measurements: Height: 5\' 4"  (162.6 cm) Weight: 180 lb (81.6 kg) IBW/kg (Calculated) : 54.7 Heparin Dosing Weight: 69.2 kg  Vital Signs: Temp: 97.5 F (36.4 C) (05/08 1058) Temp Source: Oral (05/08 1058) BP: 124/66 (05/08 1058) Pulse Rate: 93 (05/08 1058)  Labs: Recent Labs    01/21/19 1134  01/21/19 1203 01/21/19 1556 01/21/19 1931 01/21/19 2314 01/22/19 0406  HGB  --   --  11.5*  --   --   --  10.2*  HCT  --   --  36.3  --   --   --  32.4*  PLT  --   --  364  --   --   --  326  HEPARINUNFRC  --   --   --   --   --  0.29*  --   CREATININE 0.81  --  0.71  --   --   --  0.65  TROPONINI  --    < > <0.03 <0.03 <0.03 <0.03  --    < > = values in this interval not displayed.    Estimated Creatinine Clearance: 60.9 mL/min (by C-G formula based on SCr of 0.65 mg/dL).   Medical History: Past Medical History:  Diagnosis Date  . Complication of anesthesia 1985   bp, hr and breathing bottomed out during surgery  . Dysrhythmia 01/21/2019  . Hypertension   . No pertinent past medical history    patient stated that she has not seen a doctor in 20 years  . Stroke [facility])     Medications:  Medications Prior to Admission  Medication Sig Dispense Refill Last Dose  . aspirin EC 81 MG tablet Take 81 mg by mouth daily.   Taking   Scheduled:  . aspirin EC  81 mg Oral Daily  . atorvastatin  10 mg Oral q1800  . clopidogrel  75 mg Oral Daily  . collagenase   Topical Daily  . diltiazem  60 mg Oral Q6H  . Heparin (Porcine) in NaCl      . heparin      . nicotine  21 mg Transdermal Daily   Infusions:  . sodium chloride 75 mL/hr at 01/22/19 0157  . sodium chloride 75 mL/hr at 01/22/19 1129  .  ceFAZolin (ANCEF) IV     . heparin     PRN: acetaminophen **OR** acetaminophen, diphenhydrAMINE, famotidine, hydrALAZINE, HYDROmorphone (DILAUDID) injection, methylPREDNISolone (SOLU-MEDROL) injection, midazolam, ondansetron **OR** ondansetron (ZOFRAN) IV, ondansetron (ZOFRAN) IV, traMADol Anti-infectives (From admission, onward)   Start     Dose/Rate Route Frequency Ordered Stop   01/22/19 1115  ceFAZolin (ANCEF) IVPB 2g/100 mL premix     2 g 200 mL/hr over 30 Minutes Intravenous  Once 01/22/19 1112     01/21/19 1115  ceFAZolin (ANCEF) IVPB 2g/100 mL premix     2 g 200 mL/hr over 30 Minutes Intravenous  Once 01/21/19 1106 01/22/19 1136   01/21/19 1111  ceFAZolin (ANCEF) 2-4 GM/100ML-% IVPB    Note to Pharmacy:  Littie Deeds  : cabinet override      01/21/19 1111 01/21/19 1538      Assessment: Pharmacy was consulted to start heparin for afib. Not on PTA anticoagulation. CHA2DS2VASc score of at least 7 (HTN, agex2, strokex2,  vascular, female) and pending labs and echo for further risk stratification.   05/07 @ 2300 HL 0.29 borderline subtherapeutic. Will increase rate to 1050 units/hr  Goal of Therapy:  Heparin level 0.3-0.7 units/ml Monitor platelets by anticoagulation protocol: Yes   Plan:  Patient underwent vascular procedure. Holding heparin until 5/8 1600 per RN. Order placed to start at 1600 and HL ordered for midnight. Restarting rate @1050  units/hr. CBC stable will continue to monitor.  Ronnald RampKishan S Jezel Basto, PharmD, BCPS 01/22/2019,1:11 PM

## 2019-01-22 NOTE — Progress Notes (Signed)
[facility]   Communication Note 1) Dr. Wyn Quaker spoke with patients husband s/p intervention. The patient will need a transmetatarsal amputation even though arterial patency has been restored to the right lower extremity. Would prefer to plan for this next week as outpatient. Patients husband to speak with her and call our office with decision.  2) OK to discharge home from vascular standpoint when medically stable.  3) Will place discharge follow up.  Discussed with Dr. Wallis Mart Romell Cavanah PA-C 01/22/2019 1:31 PM

## 2019-01-22 NOTE — Plan of Care (Signed)
  Problem: Clinical Measurements: Goal: Cardiovascular complication will be avoided Outcome: Progressing   

## 2019-01-22 NOTE — Progress Notes (Signed)
[employer] - [place] at [facility]     PATIENT NAME: Krystal Herrera    MR#:  [id number]  DATE OF BIRTH:  1941-07-13  SUBJECTIVE:   Patient admitted to the hospital yesterday due to A. fib with RVR.  Heart rates are improved with Cardizem drip.  Status post right lower extremity angiogram with stent placement to the right superficial femoral artery.  Patient denies any other complaints presently.  REVIEW OF SYSTEMS:    Review of Systems  Constitutional: Negative for chills and fever.  HENT: Negative for congestion and tinnitus.   Eyes: Negative for blurred vision and double vision.  Respiratory: Negative for cough, shortness of breath and wheezing.   Cardiovascular: Negative for chest pain, orthopnea and PND.  Gastrointestinal: Negative for abdominal pain, diarrhea, nausea and vomiting.  Genitourinary: Negative for dysuria and hematuria.  Neurological: Negative for dizziness, sensory change and focal weakness.  All other systems reviewed and are negative.   Nutrition: Heart Healthy Tolerating Diet: Yes Tolerating PT: Await Eval.   DRUG ALLERGIES:   Allergies  Allergen Reactions  . Hydrocodone-Acetaminophen Swelling    Itching to arms and swelling to face and lips, improved with benadryl.    VITALS:  Blood pressure 124/66, pulse 93, temperature (!) 97.5 F (36.4 C), temperature source Oral, resp. rate (!) 32, height 5\' 4"  (1.626 m), weight 81.6 kg, SpO2 91 %.  PHYSICAL EXAMINATION:   Physical Exam  GENERAL:  78 y.o.-year-old patient lying in bed in no acute distress.  EYES: Pupils equal, round, reactive to light and accommodation. No scleral icterus. Extraocular muscles intact.  HEENT: Head atraumatic, normocephalic. Oropharynx and nasopharynx clear.  NECK:  Supple, no jugular venous distention. No thyroid enlargement, no tenderness.  LUNGS: Normal breath sounds bilaterally, no wheezing, rales, rhonchi. No use of accessory muscles of respiration.   CARDIOVASCULAR: S1, S2 Irregular rate. No murmurs, rubs, or gallops.  ABDOMEN: Soft, nontender, nondistended. Bowel sounds present. No organomegaly or mass.  EXTREMITIES: No cyanosis, clubbing or edema b/l.    NEUROLOGIC: Cranial nerves II through XII are intact. No focal Motor or sensory deficits b/l.   PSYCHIATRIC: The patient is alert and oriented x 3.  SKIN: No obvious rash, lesion, Right foot ulcer as shown below      LABORATORY PANEL:   CBC Recent Labs  Lab 01/22/19 0406  WBC 10.1  HGB 10.2*  HCT 32.4*  PLT 326   ------------------------------------------------------------------------------------------------------------------  Chemistries  Recent Labs  Lab 01/22/19 0406  NA 139  K 3.9  CL 106  CO2 23  GLUCOSE 129*  BUN 18  CREATININE 0.65  CALCIUM 8.3*  MG 2.0   ------------------------------------------------------------------------------------------------------------------  Cardiac Enzymes Recent Labs  Lab 01/21/19 2314  TROPONINI <0.03   ------------------------------------------------------------------------------------------------------------------  RADIOLOGY:  Dg Chest Port 1 View  Result Date: 01/21/2019 CLINICAL DATA:  Hypertension.  Atrial fibrillation. EXAM: PORTABLE CHEST 1 VIEW COMPARISON:  None. FINDINGS: 1256 hours. The lungs are clear without focal pneumonia, edema, pneumothorax or pleural effusion. Interstitial markings are diffusely coarsened with chronic features. Cardiopericardial silhouette is at upper limits of normal for size. The visualized bony structures of the thorax are intact. Telemetry leads overlie the chest. IMPRESSION: No active disease. Electronically Signed   By: Kennith CenterEric  Mansell M.D.   On: 01/21/2019 13:27     ASSESSMENT AND PLAN:   78 year old female with past medical history of remote CVA, hypertension, ongoing tobacco abuse who presents to the hospital due to angiogram for a right lower extremity ulcer  and noted to be  in atrial fibrillation with rapid ventricular response.  1.  Atrial fibrillation with rapid ventricular response - new onset for the patient.  Patient is clinically asymptomatic with no palpitation shortness of breath or chest pain. -Seen by cardiology, placed on a Cardizem drip and will continue.  Heart rates have improved.  Continue heparin drip for now. -Plan to possibly change to oral Cardizem and anticoagulation later today or tomorrow. - TSH normal.  Echo showing EF of 50 to 55% with no thrombus and mild right atrial enlargement.  2.  Right lower extremity ulcer- thought to be a vascular ulcer.   Status post right lower extremity angiogram today with angioplasty to right superficial femoral artery.  Continue further care as per vascular surgery.  Wound team has been consulted for local wound care.  Patient likely will probably need amputation in the near future.  3.  Essential hypertension- patient currently on no medications. -Continue Cardizem drip for now and BP under good control.      All the records are reviewed and case discussed with Care Management/Social Worker. Management plans discussed with the patient, family and they are in agreement.  CODE STATUS: DNR  DVT Prophylaxis: Heparin gtt  TOTAL TIME TAKING CARE OF THIS PATIENT: 30 minutes.   POSSIBLE D/C IN 2-3 DAYS, DEPENDING ON CLINICAL CONDITION.   Houston Siren M.D on 01/22/2019 at 1:56 PM  Between 7am to 6pm - Pager - [phone]  After 6pm go to [email or web address] - Social research officer, government  [employer]  Hospitalists  Office  [phone]  CC: Primary care physician; Patient, No Pcp Per

## 2019-01-22 NOTE — Op Note (Signed)
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  Ultrasound guidance for vascular access left femoral artery             2.  Catheter placement into right common femoral artery from left femoral approach             3.  Aortogram and selective right lower extremity angiogram             4.  Percutaneous transluminal angioplasty of right SFA and above-knee popliteal arteries with 4 mm diameter conventional and 5 mm diameter Lutonix drug-coated angioplasty balloons             5.   Viabahn stent placement to the right proximal to mid SFA with a 6 mm diameter by 15 cm length stent  6.  StarClose closure device left femoral artery  EBL: 10 cc  Contrast: 60 cc  Fluoro Time: 10.5 minutes  Moderate Conscious Sedation Time: approximately 35 minutes using 5 mg of Versed and 50 mcg of Fentanyl              Indications:  Patient is a 78 y.o.female with a frankly gangrenous foot. The patient has noninvasive study showing very poor flow in the right lower extremity. The patient is brought in for angiography for further evaluation and potential treatment.  Due to the limb threatening nature of the situation, angiogram was performed for attempted limb salvage. The patient is aware that if the procedure fails, amputation would be expected.  The patient also understands that even with successful revascularization, amputation may still be required due to the severity of the situation.  Risks and benefits are discussed and informed consent is obtained.   Procedure:  The patient was identified and appropriate procedural time out was performed.  The patient was then placed supine on the table and prepped and draped in the usual sterile fashion. Moderate conscious sedation  was administered during a face to face encounter with the patient throughout the procedure with my supervision of the RN administering medicines and monitoring the patient's vital signs, pulse oximetry, telemetry and mental status throughout from the start of the procedure until the patient was taken to the recovery room. Ultrasound was used to evaluate the left common femoral artery.  It was patent .  A digital ultrasound image was acquired.  A Seldinger needle was used to access the left common femoral artery under direct ultrasound guidance and a permanent image was performed.  A 0.035 J wire was advanced without resistance and a 5Fr sheath was placed.  Pigtail catheter was placed into the aorta and an AP aortogram was performed. This demonstrated some degree of renal artery stenosis appear to be present more so on the left than the right.  There also appeared to be 2 right renal arteries but imaging was very poor due to continuous patient motion.  The aorta and iliac arteries were patent without significant stenosis. I then crossed the aortic bifurcation and advanced to the right femoral head in the right common femoral artery. Selective right lower extremity angiogram was then performed. This demonstrated near flush occlusion of the right SFA.  The common femoral artery and profunda femoris arteries were widely patent.  There was reconstitution of the  above-knee popliteal artery.  The peroneal artery was the dominant runoff distally.  The anterior tibial artery was patent over the first 8 to 10 cm of the vessel but appeared to occlude.  Image quality was extremely poor due to patient motion and it was difficult to tell if there was distal reconstitution or not.  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 It was felt that it was in the patient's best interest to proceed with intervention after these images to avoid a second procedure and a larger amount of contrast and fluoroscopy  based off of the findings from the initial angiogram. The patient was systemically heparinized and a 6 [place] Ansell sheath was then placed over the [employer] wire. I then used a Kumpe catheter and the advantage wire to navigate into the SFA and cross the occlusion with the help of a CXI catheter and the advantage wire.  The CXI catheter was then advanced down to the below-knee popliteal artery to better opacify the tibial vessels.  The peroneal artery was the dominant runoff without focal stenosis.  The anterior tibial artery was occluded about 10 cm beyond its origin but there was some distal reconstitution in the foot.  The posterior tibial artery was chronically occluded without distal reconstitution.  I then advanced the CXI catheter into the anterior tibial artery and selective imaging confirmed a long segment occlusion with distal reconstitution.  Attempts at crossing this were unsuccessful largely due to continuous patient motion and poor visualization and it was clear if we were going to improve her inflow we would need to go ahead and treat her SFA occlusion.  A 4 mm diameter by 30 cm length angioplasty balloon was used to treat from the proximal SFA to the above-knee popliteal artery and inflated to 10 atm for 1 minute.  I then used a 5 mm diameter by 30 cm length Lutonix drug-coated angioplasty balloon inflated this to 8 atm for 1 minute.  Completion imaging showed areas of greater than 50% residual stenosis and wall irregularity in the proximal and mid SFA.  The distal SFA and proximal popliteal artery were patent with less than 20% residual stenosis after angioplasty.  A 6 mm diameter by 15 cm length via bond stent was then deployed in the proximal to mid right SFA to encompass the residual lesions and postdilated with a 5 mm balloon with excellent angiographic result and less than 10% residual stenosis. I elected to terminate the procedure. The sheath was removed and StarClose closure device  was deployed in the left femoral artery with excellent hemostatic result. The patient was taken to the recovery room in stable condition having tolerated the procedure well.  Findings:               Aortogram:  Some degree of renal artery stenosis appear to be present more so on the left than the right.  There also appeared to be 2 right renal arteries but imaging was very poor due to continuous patient motion.  The aorta and iliac arteries were patent without significant stenosis.             Right lower Extremity:  This demonstrated near flush occlusion of the right SFA.  The common femoral artery and profunda femoris arteries were widely patent.  There was reconstitution of the above-knee popliteal artery.  The peroneal artery was the dominant runoff distally.  The anterior tibial artery was patent over the first 8 to 10 cm of the  vessel but appeared to occlude.  Image quality was extremely poor due to patient motion and it was difficult to tell if there was distal reconstitution or not.  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  Complications: None  Leotis Pain 01/22/2019 8:22 AM   This note was created with Dragon Medical transcription system. Any errors in dictation are purely unintentional.

## 2019-01-22 NOTE — Progress Notes (Signed)
Progress Note  Patient Name: Krystal Herrera Date of Encounter: 01/22/2019  Primary Cardiologist: new to [facility]  Subjective   Angioplasty with stent placement this morning with Dr. dew right lower extremity SFA, popliteal stenosis Having foot pain, receiving fentanyl for pain relief Heparin held following procedure Still on diltiazem 12.5 mg/h for rate control  Inpatient Medications    Scheduled Meds: . collagenase   Topical Daily  . diphenhydrAMINE      . fentaNYL      . Heparin (Porcine) in NaCl      . heparin      . lidocaine-EPINEPHrine      . midazolam      . [MAR Hold] nicotine  21 mg Transdermal Daily   Continuous Infusions: . sodium chloride 75 mL/hr at 01/22/19 0157  . diltiazem (CARDIZEM) infusion 12.5 mg/hr (01/22/19 0531)  . heparin     PRN Meds: [MAR Hold] acetaminophen **OR** [MAR Hold] acetaminophen, [MAR Hold] hydrALAZINE, [MAR Hold]  HYDROmorphone (DILAUDID) injection, [MAR Hold] ondansetron (ZOFRAN) IV, [MAR Hold] ondansetron **OR** [MAR Hold] ondansetron (ZOFRAN) IV, [MAR Hold] traMADol   Vital Signs    Vitals:   01/22/19 0855 01/22/19 0900 01/22/19 0915 01/22/19 0930  BP: (!) 144/83 126/83 125/72 108/67  Pulse:  (!) 118 (!) 116 (!) 104  Resp:  20 19 17   Temp:      TempSrc:      SpO2:  91% (!) 87% (!) 87%  Weight:      Height:        Intake/Output Summary (Last 24 hours) at 01/22/2019 0959 Last data filed at 01/22/2019 0300 Gross per 24 hour  Intake 775.19 ml  Output -  Net 775.19 ml   Last 3 Weights 01/22/2019 01/21/2019 01/21/2019  Weight (lbs) 180 lb 180 lb 4.8 oz 171 lb 15.3 oz  Weight (kg) 81.647 kg 81.784 kg 78 kg      Telemetry    Atrial fibrillation rate in the 80s- Personally Reviewed  ECG     - Personally Reviewed  Physical Exam   GEN:  Mild distress from foot pain, on facemask for oxygen Neck: No JVD Cardiac: Irregularly irregular no murmurs, rubs, or gallops.  Respiratory:  Coarse breath sounds otherwise clear GI: Soft,  nontender, non-distended  MS: No edema; No deformity. Large wound right foot with gangrenous toes Neuro:  Nonfocal  Psych: Lethargic  Labs    Chemistry Recent Labs  Lab 01/21/19 1134 01/21/19 1203 01/22/19 0406  NA  --  141 139  K  --  4.1 3.9  CL  --  105 106  CO2  --  22 23  GLUCOSE  --  121* 129*  BUN 11 10 18   CREATININE 0.81 0.71 0.65  CALCIUM  --  8.6* 8.3*  GFRNONAA >60 >60 >60  GFRAA >60 >60 >60  ANIONGAP  --  14 10     Hematology Recent Labs  Lab 01/21/19 1203  WBC 11.4*  RBC 3.68*  HGB 11.5*  HCT 36.3  MCV 98.6  MCH 31.3  MCHC 31.7  RDW 15.2  PLT 364    Cardiac Enzymes Recent Labs  Lab 01/21/19 1203 01/21/19 1556 01/21/19 1931 01/21/19 2314  TROPONINI <0.03 <0.03 <0.03 <0.03   No results for input(s): TROPIPOC in the last 168 hours.   BNPNo results for input(s): BNP, PROBNP in the last 168 hours.   DDimer No results for input(s): DDIMER in the last 168 hours.   Radiology    Dg Chest  Port 1 View  Result Date: 01/21/2019 CLINICAL DATA:  Hypertension.  Atrial fibrillation. EXAM: PORTABLE CHEST 1 VIEW COMPARISON:  None. FINDINGS: 1256 hours. The lungs are clear without focal pneumonia, edema, pneumothorax or pleural effusion. Interstitial markings are diffusely coarsened with chronic features. Cardiopericardial silhouette is at upper limits of normal for size. The visualized bony structures of the thorax are intact. Telemetry leads overlie the chest. IMPRESSION: No active disease. Electronically Signed   By: Kennith CenterEric  Mansell M.D.   On: 01/21/2019 13:27    Cardiac Studies   Echocardiogram performed yesterday 1. The left ventricle has low normal systolic function, with an ejection fraction of 50-55%. The cavity size was normal. Left ventricular diastolic Doppler parameters are indeterminate.  2. The right ventricle has normal systolic function. The cavity was normal. There is no increase in right ventricular wall thickness.Unable to estimate RVSP   3. Left atrial size was mildly dilated.  4. Rhythm is atrial fibrillation  Patient Profile     78 year old woman with long history of smoking,legally blind, partial thyroidectomy 1985, no recent follow-up with any physicians, presenting to the hospital for lower extremity angiogram given non-healing right foot wound and high suspicion for PAD. Prior to procedure noted to be in atrial fibrillation with RVR rate up to 160 bpm  Assessment & Plan    Atrial fibrillation with RVR Relatively asymptomatic, unclear when arrhythmia started Mildly dilated left atrium ejection fraction 50% For now would continue diltiazem infusion for rate control,  heparin infusion, restart mid afternoon given recent procedure No plan for cardioversion Transition to Eliquis and diltiazem 60 mg every 6 once tolerating oral diet  Essential hypertension Once tolerating oral diet and awake will consider transitioning to oral diltiazem  Gangrene of theRlower extremity Scheduled for angiography of lower extremities with Dr. dew tomorrow Medicine service to provide pain control, antibiotics  COPD Long smoking history Desaturations following the PV procedure Does seem to improve with encouragement for deep inspiration    Total encounter time more than 25 minutes  Greater than 50% was spent in counseling and coordination of care with the patient    For questions or updates, please contact [facility] Please consult [email or web address] for contact info under        Signed, Julien Nordmannimothy Dayle Mcnerney, MD  01/22/2019, 9:59 AM

## 2019-01-23 LAB — BASIC METABOLIC PANEL
Anion gap: 10 (ref 5–15)
BUN: 21 mg/dL (ref 8–23)
CO2: 23 mmol/L (ref 22–32)
Calcium: 8.1 mg/dL — ABNORMAL LOW (ref 8.9–10.3)
Chloride: 103 mmol/L (ref 98–111)
Creatinine, Ser: 0.67 mg/dL (ref 0.44–1.00)
GFR calc Af Amer: >60 mL/min (ref >60)
GFR calc non Af Amer: >60 mL/min (ref >60)
Glucose, Bld: 124 mg/dL — ABNORMAL HIGH (ref 70–99)
Potassium: 4 mmol/L (ref 3.5–5.1)
Sodium: 136 mmol/L (ref 135–145)

## 2019-01-23 LAB — HEPARIN LEVEL (UNFRACTIONATED): Heparin Unfractionated: 0.21 IU/mL — ABNORMAL LOW (ref 0.30–0.70)

## 2019-01-23 LAB — CBC
HCT: 31.2 % — ABNORMAL LOW (ref 36.0–46.0)
Hemoglobin: 10 g/dL — ABNORMAL LOW (ref 12.0–15.0)
MCH: 31.6 pg (ref 26.0–34.0)
MCHC: 32.1 g/dL (ref 30.0–36.0)
MCV: 98.7 fL (ref 80.0–100.0)
Platelets: 276 10*3/uL (ref 150–400)
RBC: 3.16 MIL/uL — ABNORMAL LOW (ref 3.87–5.11)
RDW: 15.4 % (ref 11.5–15.5)
WBC: 12.5 10*3/uL — ABNORMAL HIGH (ref 4.0–10.5)
nRBC: 0 % (ref 0.0–0.2)

## 2019-01-23 LAB — MAGNESIUM: Magnesium: 1.8 mg/dL (ref 1.7–2.4)

## 2019-01-23 MED ORDER — APIXABAN 5 MG PO TABS
5.0000 mg | ORAL_TABLET | Freq: Two times a day (BID) | ORAL | Status: DC
  Administered 2019-01-23: 5 mg via ORAL
  Filled 2019-01-23: qty 1

## 2019-01-23 MED ORDER — CLOPIDOGREL BISULFATE 75 MG PO TABS: 75.0000 mg | ORAL_TABLET | Freq: Every day | ORAL | 0 refills | Status: DC

## 2019-01-23 MED ORDER — ATORVASTATIN CALCIUM 10 MG PO TABS: 10.0000 mg | ORAL_TABLET | Freq: Every day | ORAL | 0 refills | Status: AC

## 2019-01-23 MED ORDER — DILTIAZEM HCL 60 MG PO TABS: 60.0000 mg | ORAL_TABLET | Freq: Four times a day (QID) | ORAL | 0 refills | Status: DC

## 2019-01-23 MED ORDER — COLLAGENASE 250 UNIT/GM EX OINT: TOPICAL_OINTMENT | Freq: Every day | CUTANEOUS | 0 refills | Status: DC

## 2019-01-23 MED ORDER — NICOTINE 21 MG/24HR TD PT24: 21.0000 mg | MEDICATED_PATCH | Freq: Every day | TRANSDERMAL | 0 refills | Status: DC

## 2019-01-23 MED ORDER — HEPARIN BOLUS VIA INFUSION
1000.0000 [IU] | Freq: Once | INTRAVENOUS | Status: AC
  Administered 2019-01-23: 1000 [IU] via INTRAVENOUS
  Filled 2019-01-23: qty 1000

## 2019-01-23 MED ORDER — APIXABAN 5 MG PO TABS: 5.0000 mg | ORAL_TABLET | Freq: Two times a day (BID) | ORAL | 0 refills | Status: DC

## 2019-01-23 NOTE — TOC Transition Note (Signed)
Transition of Care [facility]) - CM/SW Discharge Note   Patient Details  Name: Krystal Herrera MRN: [id number] Date of Birth: 1941-08-22  [facility] CM/SW Contact:  Virgel Manifold, RN Phone Number: 01/23/2019, 10:58 AM   Clinical Narrative:  Patient to be discharged per MD order. Orders in place for home health services. CMS Medicare.gov Compare Post Acute Care list reviewed with patient and she has no preference of agency as long as they take Medicare part A only. Referral placed with [facility] who can take the plan. No DME needs. Patient to discharge to home with spouse and come back for scheduled amputation.      Final next level of care: Home w Home Health Services Barriers to Discharge: No Barriers Identified   Patient Goals and CMS Choice   CMS Medicare.gov Compare Post Acute Care list provided to:: Patient Choice offered to / list presented to : Patient  Discharge Placement                       Discharge Plan and Services   Discharge Planning Services: CM Consult Post Acute Care Choice: Home Health                    HH Arranged: PT [facility] Agency: [facility] Date [facility] Agency Contacted: 01/23/19 Time HH Agency Contacted: 1058 Representative spoke with at [facility] Agency: Barbara Cower  Social Determinants of Health (SDOH) Interventions     Readmission Risk Interventions No flowsheet data found.

## 2019-01-23 NOTE — Consult Note (Signed)
ANTICOAGULATION CONSULT NOTE  Pharmacy Consult for apixaban initiation Indication: atrial fibrillation  Patient Measurements: Height: 5\' 4"  (162.6 cm) Weight: 179 lb 0.2 oz (81.2 kg) IBW/kg (Calculated) : 54.7  Vital Signs: Temp: 98.3 F (36.8 C) (05/09 0733) Temp Source: Oral (05/09 0733) BP: 118/74 (05/09 0733) Pulse Rate: 66 (05/09 0733)  Labs: Recent Labs    01/21/19 1203 01/21/19 1556 01/21/19 1931 01/21/19 2314 01/22/19 0406 01/23/19 0000  HGB 11.5*  --   --   --  10.2* 10.0*  HCT 36.3  --   --   --  32.4* 31.2*  PLT 364  --   --   --  326 276  HEPARINUNFRC  --   --   --  0.29*  --  0.21*  CREATININE 0.71  --   --   --  0.65 0.67  TROPONINI <0.03 <0.03 <0.03 <0.03  --   --     Estimated Creatinine Clearance: 60.7 mL/min (by C-G formula based on SCr of 0.67 mg/dL).   Medical History: Past Medical History:  Diagnosis Date  . Complication of anesthesia 1985   bp, hr and breathing bottomed out during surgery  . Dysrhythmia 01/21/2019  . Hypertension   . No pertinent past medical history    patient stated that she has not seen a doctor in 20 years  . Stroke [facility])     Medications:  Medications Prior to Admission  Medication Sig Dispense Refill Last Dose  . aspirin EC 81 MG tablet Take 81 mg by mouth daily.   Taking   Scheduled:  . aspirin EC  81 mg Oral Daily  . atorvastatin  10 mg Oral q1800  . clopidogrel  75 mg Oral Daily  . collagenase   Topical Daily  . diltiazem  60 mg Oral Q6H  . nicotine  21 mg Transdermal Daily   Infusions:  .  ceFAZolin (ANCEF) IV     PRN: acetaminophen **OR** acetaminophen, diphenhydrAMINE, famotidine, hydrALAZINE, HYDROmorphone (DILAUDID) injection, methylPREDNISolone (SOLU-MEDROL) injection, midazolam, ondansetron **OR** ondansetron (ZOFRAN) IV, ondansetron (ZOFRAN) IV, traMADol Anti-infectives (From admission, onward)   Start     Dose/Rate Route Frequency Ordered Stop   01/22/19 1115  ceFAZolin (ANCEF) IVPB 2g/100 mL  premix     2 g 200 mL/hr over 30 Minutes Intravenous  Once 01/22/19 1112     01/21/19 1115  ceFAZolin (ANCEF) IVPB 2g/100 mL premix     2 g 200 mL/hr over 30 Minutes Intravenous  Once 01/21/19 1106 01/22/19 1136   01/21/19 1111  ceFAZolin (ANCEF) 2-4 GM/100ML-% IVPB    Note to Pharmacy:  Littie Deeds  : cabinet override      01/21/19 1111 01/21/19 1538      Assessment: Pharmacy was consulted to start heparin for afib. Not on PTA anticoagulation. CHA2DS2VASc score of at least 7 (HTN, agex2, strokex2, vascular, female) and pending labs and echo for further risk stratification. She was started on IV heparin on admission. She meets only 1/3 dose reduction criteria.   Goal of Therapy:  Heparin level 0.3-0.7 units/ml Monitor platelets by anticoagulation protocol: Yes   Plan:  --apixaban 5 mg BID --H&H, PLT trending down: continue to monitor --CBC every 3 days per protocol  Lowella Bandy, PharmD 01/23/2019,8:49 AM

## 2019-01-23 NOTE — Progress Notes (Signed)
Progress Note  Patient Name: Krystal Herrera Date of Encounter: 01/23/2019  Primary Cardiologist:  New to [facility]   Subjective   78 year old female with a history of tobacco abuse, was admitted with gangrenous right foot and atrial fibrillation with a rapid ventricular response.  She is scheduled for amputation next week.  She has been started on Eliquis. She is on oral diltiazem.  Her heart rate at times is well controlled but slightly rapid this morning at 110.    Inpatient Medications    Scheduled Meds: . apixaban  5 mg Oral BID  . aspirin EC  81 mg Oral Daily  . atorvastatin  10 mg Oral q1800  . clopidogrel  75 mg Oral Daily  . collagenase   Topical Daily  . diltiazem  60 mg Oral Q6H  . nicotine  21 mg Transdermal Daily   Continuous Infusions: .  ceFAZolin (ANCEF) IV     PRN Meds: acetaminophen **OR** acetaminophen, diphenhydrAMINE, famotidine, hydrALAZINE, HYDROmorphone (DILAUDID) injection, methylPREDNISolone (SOLU-MEDROL) injection, midazolam, ondansetron **OR** ondansetron (ZOFRAN) IV, ondansetron (ZOFRAN) IV, traMADol   Vital Signs    Vitals:   01/22/19 2325 01/23/19 0100 01/23/19 0604 01/23/19 0733  BP: (!) 143/89  118/76 118/74  Pulse: (!) 111  (!) 117 66  Resp:   18   Temp:   98.6 F (37 C) 98.3 F (36.8 C)  TempSrc:   Oral Oral  SpO2:   91% 95%  Weight:  81.2 kg    Height:        Intake/Output Summary (Last 24 hours) at 01/23/2019 1052 Last data filed at 01/23/2019 0947 Gross per 24 hour  Intake 362.14 ml  Output 300 ml  Net 62.14 ml   Last 3 Weights 01/23/2019 01/22/2019 01/21/2019  Weight (lbs) 179 lb 0.2 oz 180 lb 180 lb 4.8 oz  Weight (kg) 81.2 kg 81.647 kg 81.784 kg      Telemetry    Atrial fib , HR 105-110  - Personally Reviewed  ECG     - Personally Reviewed  Physical Exam   GEN:  Elderly, chronically ill-appearing female, no acute distress. Neck: No JVD Cardiac:  Irregularly irregular.  Respiratory: Clear to auscultation bilaterally.  GI: Soft, nontender, non-distended  MS:  He has a gangrenous right foot. Neuro:  Nonfocal  Psych: Normal affect   Labs    Chemistry Recent Labs  Lab 01/21/19 1203 01/22/19 0406 01/23/19 0000  NA 141 139 136  K 4.1 3.9 4.0  CL 105 106 103  CO2 22 23 23   GLUCOSE 121* 129* 124*  BUN 10 18 21   CREATININE 0.71 0.65 0.67  CALCIUM 8.6* 8.3* 8.1*  GFRNONAA >60 >60 >60  GFRAA >60 >60 >60  ANIONGAP 14 10 10      Hematology Recent Labs  Lab 01/21/19 1203 01/22/19 0406 01/23/19 0000  WBC 11.4* 10.1 12.5*  RBC 3.68* 3.23* 3.16*  HGB 11.5* 10.2* 10.0*  HCT 36.3 32.4* 31.2*  MCV 98.6 100.3* 98.7  MCH 31.3 31.6 31.6  MCHC 31.7 31.5 32.1  RDW 15.2 15.8* 15.4  PLT 364 326 276    Cardiac Enzymes Recent Labs  Lab 01/21/19 1203 01/21/19 1556 01/21/19 1931 01/21/19 2314  TROPONINI <0.03 <0.03 <0.03 <0.03   No results for input(s): TROPIPOC in the last 168 hours.   BNPNo results for input(s): BNP, PROBNP in the last 168 hours.   DDimer No results for input(s): DDIMER in the last 168 hours.   Radiology    Dg Chest [facility]  1 View  Result Date: 01/21/2019 CLINICAL DATA:  Hypertension.  Atrial fibrillation. EXAM: PORTABLE CHEST 1 VIEW COMPARISON:  None. FINDINGS: 1256 hours. The lungs are clear without focal pneumonia, edema, pneumothorax or pleural effusion. Interstitial markings are diffusely coarsened with chronic features. Cardiopericardial silhouette is at upper limits of normal for size. The visualized bony structures of the thorax are intact. Telemetry leads overlie the chest. IMPRESSION: No active disease. Electronically Signed   By: Kennith CenterEric  Mansell M.D.   On: 01/21/2019 13:27    Cardiac Studies     Patient Profile     78 y.o. female with rapid atrial fibrillation and significant peripheral arterial disease.  Assessment & Plan    1.  Atrial fibrillation with rapid ventricular response.  Her rate is better on diltiazem.  Her heart rate is a little elevated this morning  but typically her heart rate is well controlled on diltiazem 60 mg every 6 hours.  I would have a low threshold to increase her diltiazem if her heart rate stays above 100.  2.  Peripheral arterial disease.  She has been seen by vascular surgery.  She is had percutaneous angioplasty of her right SFA and above-the-knee popliteal artery.  Despite restoring adequate blood flow, she will still need an amputation.   She appears to be stable for DC Follow up with Dr. Mariah MillingGollan in the office and with her primary MD   [facility] will sign off.   Medication Recommendations:  Continue Eliquis 5 BID and diltiazem for rate control.  Other recommendations (labs, testing, etc):   Follow up as an outpatient:  With Dr. Mariah MillingGollan in the office   For questions or updates, please contact [facility] Please consult [email or web address] for contact info under        Signed, Kristeen MissPhilip , MD  01/23/2019, 10:52 AM

## 2019-01-23 NOTE — Discharge Summary (Signed)
[facility] [place] [facility]   PATIENT NAME: Krystal Herrera    MR#:  [id number]  DATE OF BIRTH:  [birth year]  DATE OF ADMISSION:  01/21/2019 ADMITTING PHYSICIAN: Houston Siren, MD  DATE OF DISCHARGE: No discharge date for patient encounter.  PRIMARY CARE PHYSICIAN: Patient, No Pcp Per    ADMISSION DIAGNOSIS:  Atrial fibrillation with RVR (HCC) [I48.91]  DISCHARGE DIAGNOSIS:  Active Problems:   Atrial fibrillation with RVR (HCC)   SECONDARY DIAGNOSIS:   Past Medical History:  Diagnosis Date  . Complication of anesthesia 1985   bp, hr and breathing bottomed out during surgery  . Dysrhythmia 01/21/2019  . Hypertension   . No pertinent past medical history    patient stated that she has not seen a doctor in 20 years  . Stroke [facility])     HOSPITAL COURSE:  78 year old female with past medical history of remote CVA, hypertension, ongoing tobacco abuse who presents to the hospital due to angiogram for a right lower extremity ulcer and noted to be in atrial fibrillation withrapidventricular response.  *Acute new onset afib s/ RVR Stable Seen by cardiology/Dr. Mariah Milling - treated with Cardizem/heparin bridge to Eliquis TSH normal.  Echo showing EF of 50 to 55% with no thrombus and mild right atrial enlargement.  *Right lower extremity ulcer thought to be a vascular ulcer S/P right lower extremity angiogram/angioplasty w/ stent placement to right superficial femoral artery, per Vascular surgery -okay for discharge with plans for transmetatarsal amputation in 1 week  *Essential hypertension Stable on current regiment  DISCHARGE CONDITIONS:   stable  CONSULTS OBTAINED:  Treatment Team:  Antonieta Iba, MD Annice Needy, MD  DRUG ALLERGIES:   Allergies  Allergen Reactions  . Hydrocodone-Acetaminophen Swelling    Itching to arms and swelling to face and lips, improved with benadryl.    DISCHARGE MEDICATIONS:   Allergies as of  01/23/2019      Reactions   Hydrocodone-acetaminophen Swelling   Itching to arms and swelling to face and lips, improved with benadryl.      Medication List    TAKE these medications   apixaban 5 MG Tabs tablet Commonly known as:  ELIQUIS Take 1 tablet (5 mg total) by mouth 2 (two) times daily.   aspirin EC 81 MG tablet Take 81 mg by mouth daily.   atorvastatin 10 MG tablet Commonly known as:  LIPITOR Take 1 tablet (10 mg total) by mouth daily at 6 PM.   clopidogrel 75 MG tablet Commonly known as:  PLAVIX Take 1 tablet (75 mg total) by mouth daily. Start taking on:  Jan 24, 2019   collagenase ointment Commonly known as:  SANTYL Apply topically daily. Start taking on:  Jan 24, 2019   diltiazem 60 MG tablet Commonly known as:  CARDIZEM Take 1 tablet (60 mg total) by mouth every 6 (six) hours.   nicotine 21 mg/24hr patch Commonly known as:  NICODERM CQ - dosed in mg/24 hours Place 1 patch (21 mg total) onto the skin daily. Start taking on:  Jan 24, 2019        DISCHARGE INSTRUCTIONS:    If you experience worsening of your admission symptoms, develop shortness of breath, life threatening emergency, suicidal or homicidal thoughts you must seek medical attention immediately by calling 911 or calling your MD immediately  if symptoms less severe.  You Must read complete instructions/literature along with all the possible adverse reactions/side effects for all the Medicines you take  and that have been prescribed to you. Take any new Medicines after you have completely understood and accept all the possible adverse reactions/side effects.   Please note  You were cared for by a hospitalist during your hospital stay. If you have any questions about your discharge medications or the care you received while you were in the hospital after you are discharged, you can call the unit and asked to speak with the hospitalist on call if the hospitalist that took care of you is not  available. Once you are discharged, your primary care physician will handle any further medical issues. Please note that NO REFILLS for any discharge medications will be authorized once you are discharged, as it is imperative that you return to your primary care physician (or establish a relationship with a primary care physician if you do not have one) for your aftercare needs so that they can reassess your need for medications and monitor your lab values.    Today   CHIEF COMPLAINT:  No chief complaint on file.   HISTORY OF PRESENT ILLNESS:  78 y.o. female with past medical history of remote CVA, hypertension, ongoing tobacco abuse who presented to the hospital for elective right lower extremity angiogram given a extensive right lower extremity ulcer but preoperatively was noted to be in atrial fibrillation with rapid ventricular response.  Patient was clinically asymptomatic and denies any palpitations, chest pains, shortness of breath, nausea or vomiting or dizziness.  Patient's heart rates preoperatively were noted to be in 140s to 150s.  Patient was given pulse doses of IV Cardizem and has been placed on a Cardizem drip and is being directly admitted to the telemetry floor.  Hospitalist services were contacted for admission.   VITAL SIGNS:  Blood pressure 118/74, pulse 66, temperature 98.3 F (36.8 C), temperature source Oral, resp. rate 18, height 5\' 4"  (1.626 m), weight 81.2 kg, SpO2 95 %.  I/O:    Intake/Output Summary (Last 24 hours) at 01/23/2019 1043 Last data filed at 01/23/2019 0947 Gross per 24 hour  Intake 362.14 ml  Output 300 ml  Net 62.14 ml    PHYSICAL EXAMINATION:  GENERAL:  78 y.o.-year-old patient lying in the bed with no acute distress.  EYES: Pupils equal, round, reactive to light and accommodation. No scleral icterus. Extraocular muscles intact.  HEENT: Head atraumatic, normocephalic. Oropharynx and nasopharynx clear.  NECK:  Supple, no jugular venous  distention. No thyroid enlargement, no tenderness.  LUNGS: Normal breath sounds bilaterally, no wheezing, rales,rhonchi or crepitation. No use of accessory muscles of respiration.  CARDIOVASCULAR: S1, S2 normal. No murmurs, rubs, or gallops.  ABDOMEN: Soft, non-tender, non-distended. Bowel sounds present. No organomegaly or mass.  EXTREMITIES: No pedal edema, cyanosis, or clubbing.  NEUROLOGIC: Cranial nerves II through XII are intact. Muscle strength 5/5 in all extremities. Sensation intact. Gait not checked.  PSYCHIATRIC: The patient is alert and oriented x 3.  SKIN: No obvious rash, lesion, or ulcer.   DATA REVIEW:   CBC Recent Labs  Lab 01/23/19 0000  WBC 12.5*  HGB 10.0*  HCT 31.2*  PLT 276    Chemistries  Recent Labs  Lab 01/23/19 0000  NA 136  K 4.0  CL 103  CO2 23  GLUCOSE 124*  BUN 21  CREATININE 0.67  CALCIUM 8.1*  MG 1.8    Cardiac Enzymes Recent Labs  Lab 01/21/19 2314  TROPONINI <0.03    Microbiology Results  Results for orders placed or performed during the hospital encounter  of 01/21/19  SARS Coronavirus 2 (CEPHEID - Performed in [facility] hospital lab), Hosp Order     Status: None   Collection Time: 01/21/19  1:44 PM  Result Value Ref Range Status   SARS Coronavirus 2 NEGATIVE NEGATIVE Final    Comment: (NOTE) If result is NEGATIVE SARS-CoV-2 target nucleic acids are NOT DETECTED. The SARS-CoV-2 RNA is generally detectable in upper and lower  respiratory specimens during the acute phase of infection. The lowest  concentration of SARS-CoV-2 viral copies this assay can detect is 250  copies / mL. A negative result does not preclude SARS-CoV-2 infection  and should not be used as the sole basis for treatment or other  patient management decisions.  A negative result may occur with  improper specimen collection / handling, submission of specimen other  than nasopharyngeal swab, presence of viral mutation(s) within the  areas targeted by this  assay, and inadequate number of viral copies  (<250 copies / mL). A negative result must be combined with clinical  observations, patient history, and epidemiological information. If result is POSITIVE SARS-CoV-2 target nucleic acids are DETECTED. The SARS-CoV-2 RNA is generally detectable in upper and lower  respiratory specimens dur ing the acute phase of infection.  Positive  results are indicative of active infection with SARS-CoV-2.  Clinical  correlation with patient history and other diagnostic information is  necessary to determine patient infection status.  Positive results do  not rule out bacterial infection or co-infection with other viruses. If result is PRESUMPTIVE POSTIVE SARS-CoV-2 nucleic acids MAY BE PRESENT.   A presumptive positive result was obtained on the submitted specimen  and confirmed on repeat testing.  While 2019 novel coronavirus  (SARS-CoV-2) nucleic acids may be present in the submitted sample  additional confirmatory testing may be necessary for epidemiological  and / or clinical management purposes  to differentiate between  SARS-CoV-2 and other Sarbecovirus currently known to infect humans.  If clinically indicated additional testing with an alternate test  methodology [phone]([id number]) is advised. The SARS-CoV-2 RNA is generally  detectable in upper and lower respiratory sp ecimens during the acute  phase of infection. The expected result is Negative. Fact Sheet for Patients:  BoilerBrush.com.cyhttps://www.fda.gov/media/136312/download Fact Sheet for Healthcare Providers: https://pope.com/https://www.fda.gov/media/136313/download This test is not yet approved or cleared by the [place] FDA and has been authorized for detection and/or diagnosis of SARS-CoV-2 by FDA under an Emergency Use Authorization (EUA).  This EUA will remain in effect (meaning this test can be used) for the duration of the COVID-19 declaration under Section 564(b)(1) of the Act, 21 U.S.C. section 360bbb-3(b)(1),  unless the authorization is terminated or revoked sooner. Performed at [facility], [address] [place], [place] [address]     RADIOLOGY:  Dg Chest Port 1 View  Result Date: 01/21/2019 CLINICAL DATA:  Hypertension.  Atrial fibrillation. EXAM: PORTABLE CHEST 1 VIEW COMPARISON:  None. FINDINGS: 1256 hours. The lungs are clear without focal pneumonia, edema, pneumothorax or pleural effusion. Interstitial markings are diffusely coarsened with chronic features. Cardiopericardial silhouette is at upper limits of normal for size. The visualized bony structures of the thorax are intact. Telemetry leads overlie the chest. IMPRESSION: No active disease. Electronically Signed   By: Kennith CenterEric  Mansell M.D.   On: 01/21/2019 13:27    EKG:   Orders placed or performed during the hospital encounter of 01/21/19  . EKG 12-Lead  . EKG 12-Lead      Management plans discussed with the patient, family and  they are in agreement.  CODE STATUS:     Code Status Orders  (From admission, onward)         Start     Ordered   01/21/19 1538  Do not attempt resuscitation (DNR)  Continuous    Question Answer Comment  In the event of cardiac or respiratory ARREST Do not call a "code blue"   In the event of cardiac or respiratory ARREST Do not perform Intubation, CPR, defibrillation or ACLS   In the event of cardiac or respiratory ARREST Use medication by any route, position, wound care, and other measures to relive pain and suffering. May use oxygen, suction and manual treatment of airway obstruction as needed for comfort.      01/21/19 1537        Code Status History    This patient has a current code status but no historical code status.      TOTAL TIME TAKING CARE OF THIS PATIENT: 35 minutes.    Evelena Asa Salary M.D on 01/23/2019 at 10:43 AM  Between 7am to 6pm - Pager - [phone]  After 6pm go to [email or web address] - password [email or web address]  [facility]  Office   [phone]  CC: Primary care physician; Patient, No Pcp Per   Note: This dictation was prepared with Dragon dictation along with smaller phrase technology. Any transcriptional errors that result from this process are unintentional.

## 2019-01-23 NOTE — Consult Note (Signed)
ANTICOAGULATION CONSULT NOTE - Initial Consult  Pharmacy Consult for Heparin  Indication: atrial fibrillation  Allergies  Allergen Reactions  . Hydrocodone-Acetaminophen Swelling    Itching to arms and swelling to face and lips, improved with benadryl.    Patient Measurements: Height: 5\' 4"  (162.6 cm) Weight: 180 lb (81.6 kg) IBW/kg (Calculated) : 54.7 Heparin Dosing Weight: 69.2 kg  Vital Signs: Temp: 98 F (36.7 C) (05/08 2019) Temp Source: Oral (05/08 2019) BP: 143/89 (05/08 2325) Pulse Rate: 111 (05/08 2325)  Labs: Recent Labs    01/21/19 1203 01/21/19 1556 01/21/19 1931 01/21/19 2314 01/22/19 0406 01/23/19 0000  HGB 11.5*  --   --   --  10.2* 10.0*  HCT 36.3  --   --   --  32.4* 31.2*  PLT 364  --   --   --  326 276  HEPARINUNFRC  --   --   --  0.29*  --  0.21*  CREATININE 0.71  --   --   --  0.65 0.67  TROPONINI <0.03 <0.03 <0.03 <0.03  --   --     Estimated Creatinine Clearance: 60.9 mL/min (by C-G formula based on SCr of 0.67 mg/dL).   Medical History: Past Medical History:  Diagnosis Date  . Complication of anesthesia 1985   bp, hr and breathing bottomed out during surgery  . Dysrhythmia 01/21/2019  . Hypertension   . No pertinent past medical history    patient stated that she has not seen a doctor in 20 years  . Stroke [facility](HCC)     Medications:  Medications Prior to Admission  Medication Sig Dispense Refill Last Dose  . aspirin EC 81 MG tablet Take 81 mg by mouth daily.   Taking   Scheduled:  . aspirin EC  81 mg Oral Daily  . atorvastatin  10 mg Oral q1800  . clopidogrel  75 mg Oral Daily  . collagenase   Topical Daily  . diltiazem  60 mg Oral Q6H  . heparin  1,000 Units Intravenous Once  . nicotine  21 mg Transdermal Daily   Infusions:  .  ceFAZolin (ANCEF) IV    . heparin 1,050 Units/hr (01/22/19 1941)   PRN: acetaminophen **OR** acetaminophen, diphenhydrAMINE, famotidine, hydrALAZINE, HYDROmorphone (DILAUDID) injection,  methylPREDNISolone (SOLU-MEDROL) injection, midazolam, ondansetron **OR** ondansetron (ZOFRAN) IV, ondansetron (ZOFRAN) IV, traMADol Anti-infectives (From admission, onward)   Start     Dose/Rate Route Frequency Ordered Stop   01/22/19 1115  ceFAZolin (ANCEF) IVPB 2g/100 mL premix     2 g 200 mL/hr over 30 Minutes Intravenous  Once 01/22/19 1112     01/21/19 1115  ceFAZolin (ANCEF) IVPB 2g/100 mL premix     2 g 200 mL/hr over 30 Minutes Intravenous  Once 01/21/19 1106 01/22/19 1136   01/21/19 1111  ceFAZolin (ANCEF) 2-4 GM/100ML-% IVPB    Note to Pharmacy:  Littie DeedsJarrett, Catherine  : cabinet override      01/21/19 1111 01/21/19 1538      Assessment: Pharmacy was consulted to start heparin for afib. Not on PTA anticoagulation. CHA2DS2VASc score of at least 7 (HTN, agex2, strokex2, vascular, female) and pending labs and echo for further risk stratification.   05/07 @ 2300 HL 0.29 borderline subtherapeutic. Will increase rate to 1050 units/hr  Goal of Therapy:  Heparin level 0.3-0.7 units/ml Monitor platelets by anticoagulation protocol: Yes   Plan:  05/09 @ 0900 HL 0.21 subtherapeutic. Will rebolus w/ heparin 1000 units IV x 1 and increase rate to  1150 units/hr and will recheck HL @ 0900, CBC down from baseline, but stable will continue to monitor.  Thomasene Ripple, PharmD, BCPS 01/23/2019,12:54 AM

## 2019-01-23 NOTE — Progress Notes (Signed)
Discharge instructions and prescriptions given to pt. IVs removed. No questions from pt at this time. Pt dressed awaiting spouse to discharge home.

## 2019-01-25 ENCOUNTER — Other Ambulatory Visit (INDEPENDENT_AMBULATORY_CARE_PROVIDER_SITE_OTHER): Payer: Self-pay | Admitting: Nurse Practitioner

## 2019-01-25 ENCOUNTER — Telehealth: Payer: Self-pay | Admitting: *Deleted

## 2019-01-25 ENCOUNTER — Telehealth (INDEPENDENT_AMBULATORY_CARE_PROVIDER_SITE_OTHER): Payer: Self-pay | Admitting: Vascular Surgery

## 2019-01-25 NOTE — Telephone Encounter (Signed)
Patient contacted regarding discharge from [facility] on 01/23/19.  Patient gave verbal permission for me to speak with her husband as patient was on the other phone with another nurse from elsewhere.  Questions answered by husband: Patient understands to follow up with provider [facility] on 02/25/19 at 10:40 am at [facility]. Patient understands discharge instructions? yes Patient understands medications and regiment? yes Patient understands to bring all medications to this visit? yes

## 2019-01-25 NOTE — Telephone Encounter (Signed)
-----   Message from Antonieta Iba, MD sent at 01/24/2019  4:39 PM EDT ----- Regarding: tcm and f/u Need a TCM and tele-visit follow-up after hospitalization  thank you TG

## 2019-01-25 NOTE — Telephone Encounter (Signed)
Spoke with Elease Hashimoto at [employer] [phone]. Advised her of the below.  Please disregard earlier phone number. It was entered in error. AS,CMA

## 2019-01-25 NOTE — Telephone Encounter (Signed)
TCM .........  Patient is scheduled for 02/25/19 at 10:40 am with Dr Mariah Milling.

## 2019-01-25 NOTE — Telephone Encounter (Signed)
Patient was seen in our office on 01/12/19 with dx of Atherosclerosis of native arteries of the extremities with gangrene. Home health calling for orders on how to dress foot. I have looked everywhere and can not find any orders. Please advise how you would like foot dressed.  Spoke with Vivia Birmingham and she advised I message Dew.   Elease Hashimoto- [facility] [phone]

## 2019-01-28 ENCOUNTER — Telehealth: Payer: Self-pay

## 2019-01-28 ENCOUNTER — Telehealth: Payer: Self-pay | Admitting: Cardiovascular Disease

## 2019-01-28 NOTE — Telephone Encounter (Signed)
Scheduled

## 2019-01-28 NOTE — Telephone Encounter (Signed)
Virtual Visit Pre-Appointment Phone Call  "(Name), I am calling you today to discuss your upcoming appointment. We are currently trying to limit exposure to the virus that causes COVID-19 by seeing patients at home rather than in the office."  1. "What is the BEST phone number to call the day of the visit?" - include this in appointment notes  2. Do you have or have access to (through a family member/friend) a smartphone with video capability that we can use for your visit?" a. If yes - list this number in appt notes as cell (if different from BEST phone #) and list the appointment type as a VIDEO visit in appointment notes b. If no - list the appointment type as a PHONE visit in appointment notes  3. Confirm consent - "In the setting of the current Covid19 crisis, you are scheduled for a (phone or video) visit with your provider on (date) at (time).  Just as we do with many in-office visits, in order for you to participate in this visit, we must obtain consent.  If you'd like, I can send this to your mychart (if signed up) or email for you to review.  Otherwise, I can obtain your verbal consent now.  All virtual visits are billed to your insurance company just like a normal visit would be.  By agreeing to a virtual visit, we'd like you to understand that the technology does not allow for your provider to perform an examination, and thus may limit your provider's ability to fully assess your condition. If your provider identifies any concerns that need to be evaluated in person, we will make arrangements to do so.  Finally, though the technology is pretty good, we cannot assure that it will always work on either your or our end, and in the setting of a video visit, we may have to convert it to a phone-only visit.  In either situation, we cannot ensure that we have a secure connection.  Are you willing to proceed?" STAFF: Did the patient verbally acknowledge consent to telehealth visit? Document  YES/NO here: yes  4. Advise patient to be prepared - "Two hours prior to your appointment, go ahead and check your blood pressure, pulse, oxygen saturation, and your weight (if you have the equipment to check those) and write them all down. When your visit starts, your provider will ask you for this information. If you have an Apple Watch or Kardia device, please plan to have heart rate information ready on the day of your appointment. Please have a pen and paper handy nearby the day of the visit as well."  5. Give patient instructions for MyChart download to smartphone OR Doximity/Doxy.me as below if video visit (depending on what platform provider is using)  6. Inform patient they will receive a phone call 15 minutes prior to their appointment time (may be from unknown caller ID) so they should be prepared to answer    TELEPHONE CALL NOTE  Krystal Herrera has been deemed a candidate for a follow-up tele-health visit to limit community exposure during the Covid-19 pandemic. I spoke with the patient via phone to ensure availability of phone/video source, confirm preferred email & phone number, and discuss instructions and expectations.  I reminded Krystal Herrera to be prepared with any vital sign and/or heart rhythm information that could potentially be obtained via home monitoring, at the time of her visit. I reminded Krystal Herrera to expect a phone call prior to her visit.  Krystal Herrera 01/28/2019 11:56 AM   INSTRUCTIONS FOR DOWNLOADING THE MYCHART APP TO SMARTPHONE  - The patient must first make sure to have activated MyChart and know their login information - If Apple, go to [employer] and type in MyChart in the search bar and download the app. If Android, ask patient to go to [employer] Play Store and type in CashMyChart in the search bar and download the app. The app is free but as with any other app downloads, their phone may require them to verify saved payment information or Apple/Android  password.  - The patient will need to then log into the app with their MyChart username and password, and select [facility] as their healthcare provider to link the account. When it is time for your visit, go to the MyChart app, find appointments, and click Begin Video Visit. Be sure to Select Allow for your device to access the Microphone and Camera for your visit. You will then be connected, and your provider will be with you shortly.  **If they have any issues connecting, or need assistance please contact MyChart service desk [phone])**  **If using a computer, in order to ensure the best quality for their visit they will need to use either of the following Internet Browsers: [employer], or Google Chrome**  IF USING DOXIMITY or DOXY.ME - The patient will receive a link just prior to their visit by text.     FULL LENGTH CONSENT FOR TELE-HEALTH VISIT   I hereby voluntarily request, consent and authorize [facility] and its employed or contracted physicians, physician assistants, nurse practitioners or other licensed health care professionals (the Practitioner), to provide me with telemedicine health care services (the Services") as deemed necessary by the treating Practitioner. I acknowledge and consent to receive the Services by the Practitioner via telemedicine. I understand that the telemedicine visit will involve communicating with the Practitioner through live audiovisual communication technology and the disclosure of certain medical information by electronic transmission. I acknowledge that I have been given the opportunity to request an in-person assessment or other available alternative prior to the telemedicine visit and am voluntarily participating in the telemedicine visit.  I understand that I have the right to withhold or withdraw my consent to the use of telemedicine in the course of my care at any time, without affecting my right to future care or treatment,  and that the Practitioner or I may terminate the telemedicine visit at any time. I understand that I have the right to inspect all information obtained and/or recorded in the course of the telemedicine visit and may receive copies of available information for a reasonable fee.  I understand that some of the potential risks of receiving the Services via telemedicine include:   Delay or interruption in medical evaluation due to technological equipment failure or disruption;  Information transmitted may not be sufficient (e.g. poor resolution of images) to allow for appropriate medical decision making by the Practitioner; and/or   In rare instances, security protocols could fail, causing a breach of personal health information.  Furthermore, I acknowledge that it is my responsibility to provide information about my medical history, conditions and care that is complete and accurate to the best of my ability. I acknowledge that Practitioner's advice, recommendations, and/or decision may be based on factors not within their control, such as incomplete or inaccurate data provided by me or distortions of diagnostic images or specimens that may result from electronic transmissions. I understand that the  practice of medicine is not an Chief Strategy Officer and that Practitioner makes no warranties or guarantees regarding treatment outcomes. I acknowledge that I will receive a copy of this consent concurrently upon execution via email to the email address I last provided but may also request a printed copy by calling the office of [facility].    I understand that my insurance will be billed for this visit.   I have read or had this consent read to me.  I understand the contents of this consent, which adequately explains the benefits and risks of the Services being provided via telemedicine.   I have been provided ample opportunity to ask questions regarding this consent and the Services and have had my questions  answered to my satisfaction.  I give my informed consent for the services to be provided through the use of telemedicine in my medical care  By participating in this telemedicine visit I agree to the above.

## 2019-01-28 NOTE — Telephone Encounter (Signed)
-----   Message from Margrett Rud, [place] sent at 01/25/2019  8:54 AM EDT ----- Regarding: FW: tcm and f/u  ----- Message ----- From: Antonieta Iba, MD Sent: 01/24/2019   4:39 PM EDT To: [place], CMA, [facility] Triage Subject: tcm and f/u                                    Need a TCM and tele-visit follow-up after hospitalization  thank you TG

## 2019-01-28 NOTE — Telephone Encounter (Signed)
lmov to schedule  °

## 2019-01-29 ENCOUNTER — Ambulatory Visit (INDEPENDENT_AMBULATORY_CARE_PROVIDER_SITE_OTHER): Payer: Self-pay | Admitting: Vascular Surgery

## 2019-01-29 ENCOUNTER — Other Ambulatory Visit: Payer: Self-pay

## 2019-01-29 ENCOUNTER — Other Ambulatory Visit (INDEPENDENT_AMBULATORY_CARE_PROVIDER_SITE_OTHER): Payer: Self-pay | Admitting: Nurse Practitioner

## 2019-01-29 ENCOUNTER — Other Ambulatory Visit
Admission: RE | Admit: 2019-01-29 | Discharge: 2019-01-29 | Disposition: A | Discharge status: Home / Self Care | Payer: Self-pay | Source: Ambulatory Visit | Attending: Vascular Surgery | Admitting: Vascular Surgery

## 2019-01-29 ENCOUNTER — Encounter (INDEPENDENT_AMBULATORY_CARE_PROVIDER_SITE_OTHER): Payer: Self-pay | Admitting: Vascular Surgery

## 2019-01-29 VITALS — BP 132/69 | HR 76 | Resp 16

## 2019-01-29 DIAGNOSIS — Z79899 Other long term (current) drug therapy: Secondary | ICD-10-CM

## 2019-01-29 DIAGNOSIS — I70261 Atherosclerosis of native arteries of extremities with gangrene, right leg: Secondary | ICD-10-CM

## 2019-01-29 DIAGNOSIS — Z1159 Encounter for screening for other viral diseases: Secondary | ICD-10-CM | POA: Insufficient documentation

## 2019-01-29 DIAGNOSIS — F17210 Nicotine dependence, cigarettes, uncomplicated: Secondary | ICD-10-CM

## 2019-01-29 DIAGNOSIS — Z7901 Long term (current) use of anticoagulants: Secondary | ICD-10-CM

## 2019-01-29 DIAGNOSIS — R030 Elevated blood-pressure reading, without diagnosis of hypertension: Secondary | ICD-10-CM

## 2019-01-29 DIAGNOSIS — I4891 Unspecified atrial fibrillation: Secondary | ICD-10-CM

## 2019-01-29 DIAGNOSIS — Z95820 Peripheral vascular angioplasty status with implants and grafts: Secondary | ICD-10-CM

## 2019-01-29 NOTE — Progress Notes (Signed)
MRN : [id number]  Krystal Herrera is a 78 y.o. (1941-03-08) female who presents with chief complaint of  Chief Complaint  Patient presents with  . Follow-up    discuss surgery  .  History of Present Illness: Patient returns today in follow up of her gangrenous right foot.  Last week, she underwent revascularization of the right lower extremity which consisted of the long SFA occlusion opened with balloon angioplasty and stenting.  Her forefoot remains gangrenous.  The base of the foot is reasonably viable up to the base of the toes however at the top of the foot has a wound to the midfoot.  She is in at least 10 out of 10 pain.  She has had adverse reaction to multiple narcotics and is not able to take these.  Her hospital stay was elongated secondary to rapid atrial fibrillation which is now under better control with Cardizem.  She is also on anticoagulation because of her atrial fibrillation.  Current Outpatient Medications  Medication Sig Dispense Refill  . apixaban (ELIQUIS) 5 MG TABS tablet Take 1 tablet (5 mg total) by mouth 2 (two) times daily. 60 tablet 0  . aspirin EC 81 MG tablet Take 81 mg by mouth daily.    Marland Kitchen atorvastatin (LIPITOR) 10 MG tablet Take 1 tablet (10 mg total) by mouth daily at 6 PM. 30 tablet 0  . clopidogrel (PLAVIX) 75 MG tablet Take 1 tablet (75 mg total) by mouth daily. 30 tablet 0  . collagenase (SANTYL) ointment Apply topically daily. 15 g 0  . diltiazem (CARDIZEM) 60 MG tablet Take 1 tablet (60 mg total) by mouth every 6 (six) hours. 120 tablet 0  . nicotine (NICODERM CQ - DOSED IN MG/24 HOURS) 21 mg/24hr patch Place 1 patch (21 mg total) onto the skin daily. 28 patch 0   No current facility-administered medications for this visit.     Past Medical History:  Diagnosis Date  . Complication of anesthesia 1985   bp, hr and breathing bottomed out during surgery  . Dysrhythmia 01/21/2019  . Hypertension   . No pertinent past medical history    patient stated  that she has not seen a doctor in 20 years  . Stroke [facility])     Past Surgical History:  Procedure Laterality Date  . LOWER EXTREMITY ANGIOGRAPHY Right 01/22/2019   Procedure: LOWER EXTREMITY ANGIOGRAPHY;  Surgeon: Annice Needy, MD;  Location: [facility] INVASIVE CV LAB;  Service: Cardiovascular;  Laterality: Right;  . THYROID SURGERY    . THYROIDECTOMY, PARTIAL    . TUBAL LIGATION      Social History Social History   Tobacco Use  . Smoking status: Current Every Day Smoker    Packs/day: 1.00    Years: 40.00    Pack years: 40.00    Types: Cigarettes  . Smokeless tobacco: Never Used  Substance Use Topics  . Alcohol use: Yes    Alcohol/week: 1.0 standard drinks    Types: 1 Standard drinks or equivalent per week    Comment: daily  . Drug use: Never     Family History Family History  Problem Relation Age of Onset  . Pneumonia Father   . Heart failure Father   no bleeding or clotting disorders  Allergies  Allergen Reactions  . Hydrocodone-Acetaminophen Swelling    Itching to arms and swelling to face and lips, improved with benadryl.     REVIEW OF SYSTEMS (Negative unless checked)  Constitutional: [] Weight loss  [] Fever  []   Chills Cardiac: Chest pain   Chest pressure   Palpitations   Shortness of breath when laying flat   Shortness of breath at rest   Shortness of breath with exertion. Vascular:  Pain in legs with walking   Pain in legs at rest   Pain in legs when laying flat   Claudication   Pain in feet when walking  Pain in feet at rest  Pain in feet when laying flat   History of DVT   Phlebitis   Swelling in legs   Varicose veins   Non-healing ulcers Pulmonary:   Uses home oxygen   Productive cough   Hemoptysis   Wheeze  COPD   Asthma Neurologic:  Dizziness  Blackouts   Seizures   History of stroke   History of TIA  Aphasia   Temporary blindness   Dysphagia   Weakness or numbness in arms   Weakness or  numbness in legs Musculoskeletal:  Arthritis   Joint swelling   Joint pain   Low back pain Hematologic:  Easy bruising  Easy bleeding   Hypercoagulable state   Anemic   Gastrointestinal:  Blood in stool   Vomiting blood  Gastroesophageal reflux/heartburn   Abdominal pain Genitourinary:  Chronic kidney disease   Difficult urination  Frequent urination  Burning with urination   Hematuria Skin:  Rashes   Ulcers   Wounds Psychological:  History of anxiety    History of major depression.  Physical Examination  BP 132/69 (BP Location: Right Arm)   Pulse 76   Resp 16  Gen:  WD/WN, NAD Head: [place]/AT, No temporalis wasting. Ear/Nose/Throat: Hearing diminished, nares w/o erythema or drainage Eyes: Conjunctiva clear.  Legally blind Neck: Supple.  Trachea midline Pulmonary:  Good air movement, no use of accessory muscles.  Cardiac: RRR, no JVD Vascular:  Vessel Right Left  Radial Palpable Palpable                          PT  not palpable  not palpable  DP  not palpable  1+ palpable   Gastrointestinal: soft, non-tender/non-distended. Musculoskeletal: M/S 5/5 throughout.  The forefoot is gangrenous as described above.  There is 2-3+ right lower extremity edema and 2+ left lower extremity edema. Neurologic: Sensation grossly intact in extremities.  Symmetrical.  Speech is fluent.  Psychiatric: Judgment intact, Mood & affect appropriate for pt's clinical situation. Dermatologic: Open wound on the right foot       Labs Recent Results (from the past 2160 hour(s))  BUN     Status: None   Collection Time: 01/21/19 11:34 AM  Result Value Ref Range   BUN 11 8 - 23 mg/dL    Comment: Performed at [facility], [address] [place], [place] [address]  Creatinine, serum     Status: None   Collection Time: 01/21/19 11:34 AM  Result Value Ref Range   Creatinine, Ser 0.81 0.44 - 1.00 mg/dL   GFR calc non Af Amer >60 >60 mL/min   GFR  calc Af Amer >60 >60 mL/min    Comment: Performed at [facility], [address] [place], [place] [address]  TSH     Status: Abnormal   Collection Time: 01/21/19 11:34 AM  Result Value Ref Range   TSH 7.459 (H) 0.350 - 4.500 uIU/mL    Comment: Performed by a 3rd Generation assay with a functional sensitivity of <=0.01 uIU/mL. Performed at [facility], [address] [place], [place] [address]   CBC     Status: Abnormal   Collection Time: 01/21/19 12:03 PM  Result Value Ref Range   WBC 11.4 (H) 4.0 - 10.5 K/uL   RBC 3.68 (L) 3.87 - 5.11 MIL/uL   Hemoglobin 11.5 (L) 12.0 - 15.0 g/dL   HCT 19.1 47.8 - 29.5 %   MCV 98.6 80.0 - 100.0 fL   MCH 31.3 26.0 - 34.0 pg   MCHC 31.7 30.0 - 36.0 g/dL   RDW 62.1 30.8 - 65.7 %   Platelets 364 150 - 400 K/uL   nRBC 0.0 0.0 - 0.2 %    Comment: Performed at [facility], [address] [place], [place] [address]  Basic metabolic panel     Status: Abnormal   Collection Time: 01/21/19 12:03 PM  Result Value Ref Range   Sodium 141 135 - 145 mmol/L   Potassium 4.1 3.5 - 5.1 mmol/L    Comment: HEMOLYSIS AT THIS LEVEL MAY AFFECT RESULT   Chloride 105 98 - 111 mmol/L   CO2 22 22 - 32 mmol/L   Glucose, Bld 121 (H) 70 - 99 mg/dL   BUN 10 8 - 23 mg/dL   Creatinine, Ser 2.95 0.44 - 1.00 mg/dL   Calcium 8.6 (L) 8.9 - 10.3 mg/dL   GFR calc non Af Amer >60 >60 mL/min   GFR calc Af Amer >60 >60 mL/min   Anion gap 14 5 - 15    Comment: Performed at [facility], [address] [place], [place] [address]  Magnesium     Status: Abnormal   Collection Time: 01/21/19 12:03 PM  Result Value Ref Range   Magnesium 1.6 (L) 1.7 - 2.4 mg/dL    Comment: Performed at [facility], [address] [place], [place] [address]  Troponin I - ONCE - STAT     Status: None   Collection Time: 01/21/19 12:03 PM  Result Value Ref Range   Troponin I <0.03 <0.03 ng/mL    Comment: Performed at [facility], [address] [place], [place] [address]  Hemoglobin A1c     Status: None   Collection Time: 01/21/19 12:03 PM  Result Value Ref Range   Hgb A1c MFr Bld 5.6 4.8 - 5.6 %    Comment: (NOTE) Pre diabetes:          5.7%-6.4% Diabetes:              >6.4% Glycemic control for   <7.0% adults with diabetes    Mean Plasma Glucose 114.02 mg/dL    Comment: Performed at [facility], [address] [place], [place] [address]  ECHOCARDIOGRAM COMPLETE     Status: None   Collection Time: 01/21/19  1:39 PM  Result Value Ref Range   Weight 2,751.34 oz   Height 63 in   BP 169/106 mmHg  SARS Coronavirus 2 (CEPHEID - Performed in [facility] hospital lab), Hosp Order     Status: None   Collection Time: 01/21/19  1:44 PM  Result Value Ref Range   SARS Coronavirus 2 NEGATIVE NEGATIVE    Comment: (NOTE) If result is NEGATIVE SARS-CoV-2 target nucleic acids are NOT DETECTED. The SARS-CoV-2 RNA is generally detectable in upper and lower  respiratory specimens during the acute phase of infection. The lowest  concentration of SARS-CoV-2 viral copies this assay can detect is 250  copies / mL. A negative result does not preclude SARS-CoV-2 infection  and should not be used as the sole basis for treatment or other  patient management decisions.  A negative result may occur with  improper specimen collection / handling, submission of specimen other  than nasopharyngeal swab, presence of viral mutation(s) within the  areas targeted by this assay, and inadequate number of viral copies  (<250 copies / mL). A negative result must be combined with clinical  observations, patient history, and epidemiological information. If result is POSITIVE SARS-CoV-2 target nucleic acids are DETECTED. The SARS-CoV-2 RNA is generally detectable in upper and lower  respiratory specimens dur ing the acute phase of infection.  Positive  results are indicative of active infection with SARS-CoV-2.  Clinical  correlation with  patient history and other diagnostic information is  necessary to determine patient infection status.  Positive results do  not rule out bacterial infection or co-infection with other viruses. If result is PRESUMPTIVE POSTIVE SARS-CoV-2 nucleic acids MAY BE PRESENT.   A presumptive positive result was obtained on the submitted specimen  and confirmed on repeat testing.  While 2019 novel coronavirus  (SARS-CoV-2) nucleic acids may be present in the submitted sample  additional confirmatory testing may be necessary for epidemiological  and / or clinical management purposes  to differentiate between  SARS-CoV-2 and other Sarbecovirus currently known to infect humans.  If clinically indicated additional testing with an alternate test  methodology [phone]) is advised. The SARS-CoV-2 RNA is generally  detectable in upper and lower respiratory sp ecimens during the acute  phase of infection. The expected result is Negative. Fact Sheet for Patients:  BoilerBrush.com.cy Fact Sheet for Healthcare Providers: https://pope.com/ This test is not yet approved or cleared by the [place] FDA and has been authorized for detection and/or diagnosis of SARS-CoV-2 by FDA under an Emergency Use Authorization (EUA).  This EUA will remain in effect (meaning this test can be used) for the duration of the COVID-19 declaration under Section 564(b)(1) of the Act, 21 U.S.C. section 360bbb-3(b)(1), unless the authorization is terminated or revoked sooner. Performed at [facility], [address] [place], [place] [address]   Troponin I - Now Then Q4H     Status: None   Collection Time: 01/21/19  3:56 PM  Result Value Ref Range   Troponin I <0.03 <0.03 ng/mL    Comment: Performed at [facility], [address] [place], [place] [address]  Troponin I - Now Then Q4H     Status: None   Collection Time: 01/21/19  7:31 PM  Result Value Ref  Range   Troponin I <0.03 <0.03 ng/mL    Comment: Performed at [facility], [address] [place], [place] [address]  Heparin level (unfractionated)     Status: Abnormal   Collection Time: 01/21/19 11:14 PM  Result Value Ref Range   Heparin Unfractionated 0.29 (L) 0.30 - 0.70 IU/mL    Comment: (NOTE) If heparin results are below expected values, and patient dosage has  been confirmed, suggest follow up testing of antithrombin III levels. Performed at [facility], [address] [place], [place] [address]   Troponin I - Now Then Q4H     Status: None   Collection Time: 01/21/19 11:14 PM  Result Value Ref Range   Troponin I <0.03 <0.03 ng/mL    Comment: Performed at [facility], [address] [place], [place] [address]  Basic metabolic panel     Status: Abnormal   Collection Time: 01/22/19  4:06 AM  Result Value Ref Range   Sodium 139 135 - 145 mmol/L   Potassium 3.9 3.5 - 5.1 mmol/L   Chloride 106 98 - 111 mmol/L   CO2 23 22 - 32 mmol/L   Glucose, Bld 129 (H) 70 - 99 mg/dL   BUN 18 8 - 23 mg/dL   Creatinine, Ser 1.610.65 0.44 - 1.00 mg/dL   Calcium 8.3 (L) 8.9 - 10.3 mg/dL   GFR calc non Af Amer >60 >60 mL/min   GFR calc Af Amer >60 >60 mL/min   Anion gap 10 5 - 15    Comment: Performed at [facility], [address] [place], [place] [address]  Magnesium     Status: None   Collection Time: 01/22/19  4:06 AM  Result Value Ref Range   Magnesium 2.0 1.7 - 2.4 mg/dL    Comment: Performed at [facility], [address] [place], [place] [address]  CBC     Status: Abnormal   Collection Time: 01/22/19  4:06 AM  Result Value Ref Range   WBC 10.1 4.0 - 10.5 K/uL   RBC 3.23 (L) 3.87 - 5.11 MIL/uL   Hemoglobin 10.2 (L) 12.0 - 15.0 g/dL   HCT 11.932.4 (L) 14.736.0 - 82.946.0 %   MCV 100.3 (H) 80.0 - 100.0 fL   MCH 31.6 26.0 - 34.0 pg   MCHC 31.5 30.0 - 36.0 g/dL   RDW 56.215.8 (H) 13.011.5 - 86.515.5 %   Platelets 326 150 - 400 K/uL   nRBC 0.0 0.0 -  0.2 %    Comment: Performed at [facility], [address] [place], [place] [address]  Heparin level (unfractionated)     Status: Abnormal   Collection Time: 01/23/19 12:00 AM  Result Value Ref Range   Heparin Unfractionated 0.21 (L) 0.30 - 0.70 IU/mL    Comment: (NOTE) If heparin results are below expected values, and patient dosage has  been confirmed, suggest follow up testing of antithrombin III levels. Performed at [facility], [address] [place], [place] [address]   CBC     Status: Abnormal   Collection Time: 01/23/19 12:00 AM  Result Value Ref Range   WBC 12.5 (H) 4.0 - 10.5 K/uL   RBC 3.16 (L) 3.87 - 5.11 MIL/uL   Hemoglobin 10.0 (L) 12.0 - 15.0 g/dL   HCT 84.131.2 (L) 32.436.0 - 40.146.0 %   MCV 98.7 80.0 - 100.0 fL   MCH 31.6 26.0 - 34.0 pg   MCHC 32.1 30.0 - 36.0 g/dL   RDW 02.715.4 25.311.5 - 66.415.5 %   Platelets 276 150 - 400 K/uL   nRBC 0.0 0.0 - 0.2 %    Comment: Performed at [facility], [address] [place], [place] [address]  Basic metabolic panel     Status: Abnormal   Collection Time: 01/23/19 12:00 AM  Result Value Ref Range   Sodium 136 135 - 145 mmol/L   Potassium 4.0 3.5 - 5.1 mmol/L   Chloride 103 98 - 111 mmol/L   CO2 23 22 - 32 mmol/L   Glucose, Bld 124 (H) 70 - 99 mg/dL   BUN 21 8 - 23 mg/dL   Creatinine, Ser 4.250.67 0.44 - 1.00 mg/dL   Calcium 8.1 (L) 8.9 - 10.3 mg/dL   GFR calc non Af Amer >60 >60 mL/min   GFR calc Af Amer >60 >60 mL/min   Anion gap 10 5 - 15    Comment: Performed at [facility], [address] [place], [place] [address]  Magnesium     Status: None   Collection Time: 01/23/19 12:00 AM  Result Value Ref Range   Magnesium 1.8 1.7 - 2.4 mg/dL    Comment: Performed at [facility], [address], [place], [place] [address]    Radiology Dg Chest Gateway 1 View  Result Date: 01/21/2019 CLINICAL DATA:  Hypertension.  Atrial fibrillation. EXAM: PORTABLE CHEST 1 VIEW COMPARISON:  None.  FINDINGS: 1256 hours. The lungs are clear without focal pneumonia, edema, pneumothorax or pleural effusion. Interstitial markings are diffusely coarsened with chronic features. Cardiopericardial silhouette is at upper limits of normal for size. The visualized bony structures of the thorax are intact. Telemetry leads overlie the chest. IMPRESSION: No active disease. Electronically Signed   By: Kennith Center M.D.   On: 01/21/2019 13:27   Abi With/wo Tbi  Result Date: 01/15/2019 LOWER EXTREMITY DOPPLER STUDY Indications: Rest pain, ulceration, gangrene, and peripheral artery disease.  Performing Technologist: Reece Agar RT (R)(VS)  Examination Guidelines: A complete evaluation includes at minimum, Doppler waveform signals and systolic blood pressure reading at the level of bilateral brachial, anterior tibial, and posterior tibial arteries, when vessel segments are accessible. Bilateral testing is considered an integral part of a complete examination. Photoelectric Plethysmograph (PPG) waveforms and toe systolic pressure readings are included as required and additional duplex testing as needed. Limited examinations for reoccurring indications may be performed as noted.  ABI Findings: +---------+------------------+-----+----------+----------+ Right    Rt Pressure (mmHg)IndexWaveform  Comment    +---------+------------------+-----+----------+----------+ Brachial 163                                         +---------+------------------+-----+----------+----------+ ATA      85                0.52 monophasic           +---------+------------------+-----+----------+----------+ PTA      74                0.45 monophasic           +---------+------------------+-----+----------+----------+ Great Toe                                 gangrenous +---------+------------------+-----+----------+----------+ +---------+------------------+-----+----------+-------+ Left     Lt Pressure  (mmHg)IndexWaveform  Comment +---------+------------------+-----+----------+-------+ Brachial 164                                      +---------+------------------+-----+----------+-------+ ATA      147               0.90 monophasic        +---------+------------------+-----+----------+-------+ PTA      82                0.50 monophasic        +---------+------------------+-----+----------+-------+ Great Toe90                0.55 Abnormal          +---------+------------------+-----+----------+-------+ +-------+-----------+-----------+------------+------------+ ABI/TBIToday's ABIToday's TBIPrevious ABIPrevious TBI +-------+-----------+-----------+------------+------------+ Right  .52                                            +-------+-----------+-----------+------------+------------+  Left   .90        .55                                 +-------+-----------+-----------+------------+------------+ Summary: Right: Resting right ankle-brachial index indicates moderate right lower extremity arterial disease. Left: Resting left ankle-brachial index indicates mild left lower extremity arterial disease. The left toe-brachial index is abnormal.  *See table(s) above for measurements and observations.  Electronically signed by Festus Barren MD on 01/15/2019 at 11:29:39 AM.   Final     Assessment/Plan  Atrial fibrillation with RVR [facility]) Doing well with Cardizem and Eliquis  Elevated BP without diagnosis of hypertension Some of her blood pressure issues may be due to pain, but I suspect she has underlying hypertension which had not been treated previously.  Atherosclerosis of native arteries of the extremities with gangrene [facility]) The patient is undergone revascularization but has obvious gangrenous changes to the forefoot.  She has a marginal amount of tissue for healing a transmetatarsal amputation, but I think it is reasonable to try this and give her a shot before  proceeding with BKA.  She is on antibiotics.  Her heart is rate controlled.  We will try to get this on the schedule for next week.  She and her husband understand if this does not heal we will have to proceed with a below-knee amputation going forward.    Festus Barren, MD  01/29/2019 2:19 PM    This note was created with Dragon medical transcription system.  Any errors from dictation are purely unintentional

## 2019-01-29 NOTE — Assessment & Plan Note (Signed)
Some of her blood pressure issues may be due to pain, but I suspect she has underlying hypertension which had not been treated previously.

## 2019-01-29 NOTE — Assessment & Plan Note (Signed)
The patient is undergone revascularization but has obvious gangrenous changes to the forefoot.  She has a marginal amount of tissue for healing a transmetatarsal amputation, but I think it is reasonable to try this and give her a shot before proceeding with BKA.  She is on antibiotics.  Her heart is rate controlled.  We will try to get this on the schedule for next week.  She and her husband understand if this does not heal we will have to proceed with a below-knee amputation going forward.

## 2019-01-29 NOTE — Patient Instructions (Signed)

## 2019-01-29 NOTE — Assessment & Plan Note (Signed)
Doing well with Cardizem and Eliquis

## 2019-01-30 LAB — NOVEL CORONAVIRUS, NAA (HOSP ORDER, SEND-OUT TO REF LAB; TAT 18-24 HRS): SARS-CoV-2, NAA: NOT DETECTED

## 2019-02-01 ENCOUNTER — Encounter
Admission: RE | Admit: 2019-02-01 | Discharge: 2019-02-01 | Disposition: A | Discharge status: Home / Self Care | Payer: Self-pay | Source: Ambulatory Visit | Attending: Vascular Surgery | Admitting: Vascular Surgery

## 2019-02-01 ENCOUNTER — Other Ambulatory Visit: Payer: Self-pay

## 2019-02-01 ENCOUNTER — Encounter: Payer: Self-pay | Admitting: *Deleted

## 2019-02-01 HISTORY — DX: Bronchitis, not specified as acute or chronic: J40

## 2019-02-01 HISTORY — DX: Anxiety disorder, unspecified: F41.9

## 2019-02-01 HISTORY — DX: Unspecified osteoarthritis, unspecified site: M19.90

## 2019-02-01 MED ORDER — CEFAZOLIN SODIUM-DEXTROSE 2-4 GM/100ML-% IV SOLN
2.0000 g | INTRAVENOUS | Status: AC
  Administered 2019-02-02: 2 g via INTRAVENOUS

## 2019-02-01 NOTE — Pre-Procedure Instructions (Signed)
Messaged Dr Sampson Goon regarding pt with new onset afib with rvr and has not yet f/u oputpatient with Dr Mariah Milling. Pt had episode of afib yesterday at home per husband that resolved after about an hour. Appt not until June. Pt unable to come in today for EKG. Dr Sampson Goon said ok for Ekg to be done DOS and see what EKG shows then.

## 2019-02-01 NOTE — Pre-Procedure Instructions (Signed)
Nahser, Deloris Ping, MD  Physician  Cardiology  Progress Notes  Signed  Date of Service:  01/23/2019 10:52 AM          Signed      Expand All Collapse All    Show:Clear all [x] Manual[x] Template[] Copied  Added by: [x] Nahser, Deloris Ping, MD  [] Hover for details   Progress Note  Patient Name: Krystal Herrera Date of Encounter: 01/23/2019  Primary Cardiologist:  New to [facility]   Subjective   78 year old female with a history of tobacco abuse, was admitted with gangrenous right foot and atrial fibrillation with a rapid ventricular response.  She is scheduled for amputation next week.  She has been started on Eliquis. She is on oral diltiazem.  Her heart rate at times is well controlled but slightly rapid this morning at 110.    Inpatient Medications    Scheduled Meds:  apixaban  5 mg Oral BID   aspirin EC  81 mg Oral Daily   atorvastatin  10 mg Oral q1800   clopidogrel  75 mg Oral Daily   collagenase   Topical Daily   diltiazem  60 mg Oral Q6H   nicotine  21 mg Transdermal Daily   Continuous Infusions:   ceFAZolin (ANCEF) IV     PRN Meds: acetaminophen **OR** acetaminophen, diphenhydrAMINE, famotidine, hydrALAZINE, HYDROmorphone (DILAUDID) injection, methylPREDNISolone (SOLU-MEDROL) injection, midazolam, ondansetron **OR** ondansetron (ZOFRAN) IV, ondansetron (ZOFRAN) IV, traMADol   Vital Signs          Vitals:   01/22/19 2325 01/23/19 0100 01/23/19 0604 01/23/19 0733  BP: (!) 143/89  118/76 118/74  Pulse: (!) 111  (!) 117 66  Resp:   18   Temp:   98.6 F (37 C) 98.3 F (36.8 C)  TempSrc:   Oral Oral  SpO2:   91% 95%  Weight:  81.2 kg    Height:        Intake/Output Summary (Last 24 hours) at 01/23/2019 1052 Last data filed at 01/23/2019 0947    Gross per 24 hour  Intake 362.14 ml  Output 300 ml  Net 62.14 ml   Last 3 Weights 01/23/2019 01/22/2019 01/21/2019  Weight (lbs) 179 lb 0.2 oz 180 lb 180 lb 4.8 oz  Weight (kg)  81.2 kg 81.647 kg 81.784 kg      Telemetry    Atrial fib , HR 105-110  - Personally Reviewed  ECG     - Personally Reviewed  Physical Exam   GEN: Elderly, chronically ill-appearing female, no acute distress. Neck:No JVD Cardiac: Irregularly irregular.  Respiratory:Clear to auscultation bilaterally. YJ:EHUD, nontender, non-distended  MS: He has a gangrenous right foot. Neuro:Nonfocal  Psych: Normal affect   Labs    Chemistry LastLabs       Recent Labs  Lab 01/21/19 1203 01/22/19 0406 01/23/19 0000  NA 141 139 136  K 4.1 3.9 4.0  CL 105 106 103  CO2 22 23 23   GLUCOSE 121* 129* 124*  BUN 10 18 21   CREATININE 0.71 0.65 0.67  CALCIUM 8.6* 8.3* 8.1*  GFRNONAA >60 >60 >60  GFRAA >60 >60 >60  ANIONGAP 14 10 10        Hematology LastLabs       Recent Labs  Lab 01/21/19 1203 01/22/19 0406 01/23/19 0000  WBC 11.4* 10.1 12.5*  RBC 3.68* 3.23* 3.16*  HGB 11.5* 10.2* 10.0*  HCT 36.3 32.4* 31.2*  MCV 98.6 100.3* 98.7  MCH 31.3 31.6 31.6  MCHC 31.7 31.5 32.1  RDW 15.2 15.8* 15.4  PLT 364 326 276      Cardiac Enzymes LastLabs        Recent Labs  Lab 01/21/19 1203 01/21/19 1556 01/21/19 1931 01/21/19 2314  TROPONINI <0.03 <0.03 <0.03 <0.03      LastLabs  No results for input(s): TROPIPOC in the last 168 hours.     BNP LastLabs  No results for input(s): BNP, PROBNP in the last 168 hours.     DDimer  Beau FannyLastLabs  No results for input(s): DDIMER in the last 168 hours.     Radiology     ImagingResults(Last48hours)  Dg Chest Port 1 View  Result Date: 01/21/2019 CLINICAL DATA:  Hypertension.  Atrial fibrillation. EXAM: PORTABLE CHEST 1 VIEW COMPARISON:  None. FINDINGS: 1256 hours. The lungs are clear without focal pneumonia, edema, pneumothorax or pleural effusion. Interstitial markings are diffusely coarsened with chronic features. Cardiopericardial silhouette is at upper limits of normal for size. The  visualized bony structures of the thorax are intact. Telemetry leads overlie the chest. IMPRESSION: No active disease. Electronically Signed   By: Kennith CenterEric  Mansell M.D.   On: 01/21/2019 13:27     Cardiac Studies     Patient Profile     78 y.o. female with rapid atrial fibrillation and significant peripheral arterial disease.  Assessment & Plan    1.  Atrial fibrillation with rapid ventricular response.  Her rate is better on diltiazem.  Her heart rate is a little elevated this morning but typically her heart rate is well controlled on diltiazem 60 mg every 6 hours.  I would have a low threshold to increase her diltiazem if her heart rate stays above 100.  2.  Peripheral arterial disease.  She has been seen by vascular surgery.  She is had percutaneous angioplasty of her right SFA and above-the-knee popliteal artery.  Despite restoring adequate blood flow, she will still need an amputation.   She appears to be stable for DC Follow up with Dr. Mariah MillingGollan in the office and with her primary MD   [facility] will sign off.   Medication Recommendations:  Continue Eliquis 5 BID and diltiazem for rate control.  Other recommendations (labs, testing, etc):   Follow up as an outpatient:  With Dr. Mariah MillingGollan in the office   For questions or updates, please contact [facility] Please consult [email or web address] for contact info under        Signed, Kristeen MissPhilip Nahser, MD  01/23/2019, 10:52 AM           Electronically signed by Vesta MixerNahser, Philip J, MD at 01/23/2019 11:03 AM     Admission (Discharged) on 01/21/2019       Routing History       Detailed Report

## 2019-02-01 NOTE — Patient Instructions (Signed)
Your procedure is scheduled on: 02-02-19 Report to Same Day Surgery 2nd floor medical mall [facility] Entrance-take elevator on left to 2nd floor.  Check in with surgery information desk.) To find out your arrival time please call [phone] between 1PM - 3PM on 02-01-19   Remember: Instructions that are not followed completely may result in serious medical risk, up to and including death, or upon the discretion of your surgeon and anesthesiologist your surgery may need to be rescheduled.    _x___ 1. Do not eat food after midnight the night before your procedure. You may drink clear liquids up to 2 hours before you are scheduled to arrive at the hospital for your procedure.  Do not drink clear liquids within 2 hours of your scheduled arrival to the hospital.  Clear liquids include  --Water or Apple juice without pulp  --Clear carbohydrate beverage such as ClearFast or Gatorade  --Black Coffee or Clear Tea (No milk, no creamers, do not add anything to the coffee or Tea   ____Ensure clear carbohydrate drink on the way to the hospital for bariatric patients  ____Ensure clear carbohydrate drink 3 hours before surgery for Dr Rutherford Nail patients if physician instructed.   No gum chewing or hard candies.     __x__ 2. No Alcohol for 24 hours before or after surgery.   __x__3. No Smoking or e-cigarettes for 24 prior to surgery.  Do not use any chewable tobacco products for at least 6 hour prior to surgery   ____  4. Bring all medications with you on the day of surgery if instructed.    __x__ 5. Notify your doctor if there is any change in your medical condition     (cold, fever, infections).    x___6. On the morning of surgery brush your teeth with toothpaste and water.  You may rinse your mouth with mouth wash if you wish.  Do not swallow any toothpaste or mouthwash.   Do not wear jewelry, make-up, hairpins, clips or nail polish.  Do not wear lotions, powders, or perfumes. You may wear  deodorant.  Do not shave 48 hours prior to surgery. Men may shave face and neck.  Do not bring valuables to the hospital.    [facility] is not responsible for any belongings or valuables.               Contacts, dentures or bridgework may not be worn into surgery.  Leave your suitcase in the car. After surgery it may be brought to your room.  For patients admitted to the hospital, discharge time is determined by your  treatment team.  _  Patients discharged the day of surgery will not be allowed to drive home.  You will need someone to drive you home and stay with you the night of your procedure.    Please read over the following fact sheets that you were given:   [facility] Preparing for Surgery   _x___ TAKE THE FOLLOWING MEDICATION THE MORNING OF SURGERY WITH A SMALL SIP OF WATER. These include:  1. DILTIAZEM  2.  3.  4.  5.  6.  ____Fleets enema or Magnesium Citrate as directed.   _x___ Use CHG Soap or sage wipes as directed on instruction sheet   ____ Use inhalers on the day of surgery and bring to hospital day of surgery  ____ Stop Metformin and Janumet 2 days prior to surgery.    ____ Take 1/2 of usual insulin dose the night  before surgery and none on the morning     surgery.   _x___ Follow recommendations from Cardiologist, Pulmonologist or PCP regarding stopping Aspirin, Coumadin, Plavix ,Eliquis, Effient, or Pradaxa, and Pletal-PT LAST TOOK 325 MG ASPIRIN THIS AM (02-01-19) CONTINUE PLAVIX/ELOQUIS-DO NOT TAKE AM OF SURGERY  X____Stop Anti-inflammatories such as Advil, Aleve, Ibuprofen, Motrin, Naproxen, Naprosyn, Goodies powders or aspirin products NOW-OK to take Tylenol    ____ Stop supplements until after surgery.     ____ Bring C-Pap to the hospital.

## 2019-02-02 ENCOUNTER — Other Ambulatory Visit: Payer: Self-pay

## 2019-02-02 ENCOUNTER — Ambulatory Visit: Payer: Self-pay | Admitting: Anesthesiology

## 2019-02-02 ENCOUNTER — Observation Stay
Admission: RE | Admit: 2019-02-02 | Discharge: 2019-02-03 | Disposition: A | Discharge status: Home / Self Care | Payer: Self-pay | Attending: Vascular Surgery | Admitting: Vascular Surgery

## 2019-02-02 ENCOUNTER — Telehealth: Payer: Self-pay | Admitting: Cardiovascular Disease

## 2019-02-02 ENCOUNTER — Encounter
Admission: RE | Disposition: A | Discharge status: Home / Self Care | Payer: Self-pay | Source: Home / Self Care | Attending: Vascular Surgery

## 2019-02-02 DIAGNOSIS — Z7902 Long term (current) use of antithrombotics/antiplatelets: Secondary | ICD-10-CM | POA: Insufficient documentation

## 2019-02-02 DIAGNOSIS — M6281 Muscle weakness (generalized): Secondary | ICD-10-CM | POA: Insufficient documentation

## 2019-02-02 DIAGNOSIS — Z7901 Long term (current) use of anticoagulants: Secondary | ICD-10-CM | POA: Insufficient documentation

## 2019-02-02 DIAGNOSIS — I96 Gangrene, not elsewhere classified: Secondary | ICD-10-CM

## 2019-02-02 DIAGNOSIS — I4891 Unspecified atrial fibrillation: Secondary | ICD-10-CM | POA: Insufficient documentation

## 2019-02-02 DIAGNOSIS — Z79899 Other long term (current) drug therapy: Secondary | ICD-10-CM | POA: Insufficient documentation

## 2019-02-02 DIAGNOSIS — F17210 Nicotine dependence, cigarettes, uncomplicated: Secondary | ICD-10-CM | POA: Insufficient documentation

## 2019-02-02 DIAGNOSIS — I2510 Atherosclerotic heart disease of native coronary artery without angina pectoris: Secondary | ICD-10-CM | POA: Insufficient documentation

## 2019-02-02 DIAGNOSIS — Z9181 History of falling: Secondary | ICD-10-CM | POA: Insufficient documentation

## 2019-02-02 DIAGNOSIS — Z8673 Personal history of transient ischemic attack (TIA), and cerebral infarction without residual deficits: Secondary | ICD-10-CM | POA: Insufficient documentation

## 2019-02-02 DIAGNOSIS — I10 Essential (primary) hypertension: Secondary | ICD-10-CM | POA: Insufficient documentation

## 2019-02-02 DIAGNOSIS — R2689 Other abnormalities of gait and mobility: Secondary | ICD-10-CM | POA: Insufficient documentation

## 2019-02-02 DIAGNOSIS — E876 Hypokalemia: Secondary | ICD-10-CM | POA: Insufficient documentation

## 2019-02-02 DIAGNOSIS — Z885 Allergy status to narcotic agent status: Secondary | ICD-10-CM | POA: Insufficient documentation

## 2019-02-02 HISTORY — PX: TRANSMETATARSAL AMPUTATION: SHX6197

## 2019-02-02 LAB — CBC WITH DIFFERENTIAL/PLATELET
Abs Immature Granulocytes: 0.08 10*3/uL — ABNORMAL HIGH (ref 0.00–0.07)
Basophils Absolute: 0 10*3/uL (ref 0.0–0.1)
Basophils Relative: 0 %
Eosinophils Absolute: 0.2 10*3/uL (ref 0.0–0.5)
Eosinophils Relative: 2 %
HCT: 29.2 % — ABNORMAL LOW (ref 36.0–46.0)
Hemoglobin: 9.5 g/dL — ABNORMAL LOW (ref 12.0–15.0)
Immature Granulocytes: 1 %
Lymphocytes Relative: 13 %
Lymphs Abs: 1 10*3/uL (ref 0.7–4.0)
MCH: 31.8 pg (ref 26.0–34.0)
MCHC: 32.5 g/dL (ref 30.0–36.0)
MCV: 97.7 fL (ref 80.0–100.0)
Monocytes Absolute: 0.8 10*3/uL (ref 0.1–1.0)
Monocytes Relative: 10 %
Neutro Abs: 5.8 10*3/uL (ref 1.7–7.7)
Neutrophils Relative %: 74 %
Platelets: 302 10*3/uL (ref 150–400)
RBC: 2.99 MIL/uL — ABNORMAL LOW (ref 3.87–5.11)
RDW: 18.1 % — ABNORMAL HIGH (ref 11.5–15.5)
WBC: 7.9 10*3/uL (ref 4.0–10.5)
nRBC: 0 % (ref 0.0–0.2)

## 2019-02-02 LAB — BASIC METABOLIC PANEL
Anion gap: 13 (ref 5–15)
BUN: 32 mg/dL — ABNORMAL HIGH (ref 8–23)
CO2: 21 mmol/L — ABNORMAL LOW (ref 22–32)
Calcium: 7.6 mg/dL — ABNORMAL LOW (ref 8.9–10.3)
Chloride: 109 mmol/L (ref 98–111)
Creatinine, Ser: 1.07 mg/dL — ABNORMAL HIGH (ref 0.44–1.00)
GFR calc Af Amer: 58 mL/min — ABNORMAL LOW (ref >60)
GFR calc non Af Amer: 50 mL/min — ABNORMAL LOW (ref >60)
Glucose, Bld: 128 mg/dL — ABNORMAL HIGH (ref 70–99)
Potassium: 3.1 mmol/L — ABNORMAL LOW (ref 3.5–5.1)
Sodium: 143 mmol/L (ref 135–145)

## 2019-02-02 LAB — PROTIME-INR
INR: 3.7 — ABNORMAL HIGH (ref 0.8–1.2)
Prothrombin Time: 36.2 seconds — ABNORMAL HIGH (ref 11.4–15.2)

## 2019-02-02 LAB — APTT: aPTT: 47 seconds — ABNORMAL HIGH (ref 24–36)

## 2019-02-02 LAB — TYPE AND SCREEN
ABO/RH(D): A POS
Antibody Screen: NEGATIVE

## 2019-02-02 LAB — MAGNESIUM: Magnesium: 2 mg/dL (ref 1.7–2.4)

## 2019-02-02 LAB — ABO/RH: ABO/RH(D): A POS

## 2019-02-02 SURGERY — AMPUTATION, FOOT, TRANSMETATARSAL
Anesthesia: General | Laterality: Right

## 2019-02-02 MED ORDER — KCL IN DEXTROSE-NACL 40-5-0.9 MEQ/L-%-% IV SOLN
INTRAVENOUS | Status: DC
  Administered 2019-02-02 – 2019-02-03 (×2): via INTRAVENOUS
  Filled 2019-02-02 (×2): qty 1000

## 2019-02-02 MED ORDER — TRAMADOL HCL 50 MG PO TABS
50.0000 mg | ORAL_TABLET | Freq: Four times a day (QID) | ORAL | Status: DC | PRN
  Administered 2019-02-02: 50 mg via ORAL
  Filled 2019-02-02: qty 1

## 2019-02-02 MED ORDER — PROPOFOL 10 MG/ML IV BOLUS
INTRAVENOUS | Status: DC | PRN
  Administered 2019-02-02: 50 mg via INTRAVENOUS

## 2019-02-02 MED ORDER — ATORVASTATIN CALCIUM 10 MG PO TABS
10.0000 mg | ORAL_TABLET | Freq: Every day | ORAL | Status: DC
  Administered 2019-02-02: 10 mg via ORAL
  Filled 2019-02-02: qty 1

## 2019-02-02 MED ORDER — PROPOFOL 10 MG/ML IV BOLUS
INTRAVENOUS | Status: AC
  Filled 2019-02-02: qty 20

## 2019-02-02 MED ORDER — SUGAMMADEX SODIUM 200 MG/2ML IV SOLN
INTRAVENOUS | Status: DC | PRN
  Administered 2019-02-02: 162.4 mg via INTRAVENOUS

## 2019-02-02 MED ORDER — CEFAZOLIN SODIUM-DEXTROSE 2-4 GM/100ML-% IV SOLN
INTRAVENOUS | Status: AC
  Filled 2019-02-02: qty 100

## 2019-02-02 MED ORDER — NICOTINE 21 MG/24HR TD PT24
21.0000 mg | MEDICATED_PATCH | Freq: Every day | TRANSDERMAL | Status: DC
  Filled 2019-02-02 (×2): qty 1

## 2019-02-02 MED ORDER — CHLORHEXIDINE GLUCONATE CLOTH 2 % EX PADS: 6.0000 | MEDICATED_PAD | Freq: Once | CUTANEOUS | Status: DC

## 2019-02-02 MED ORDER — ACETAMINOPHEN 325 MG PO TABS: 650.0000 mg | ORAL_TABLET | Freq: Four times a day (QID) | ORAL | Status: DC | PRN

## 2019-02-02 MED ORDER — KETOROLAC TROMETHAMINE 30 MG/ML IJ SOLN
30.0000 mg | Freq: Three times a day (TID) | INTRAMUSCULAR | Status: DC
  Filled 2019-02-02: qty 1

## 2019-02-02 MED ORDER — FLEET ENEMA 7-19 GM/118ML RE ENEM: 1.0000 | ENEMA | Freq: Once | RECTAL | Status: DC | PRN

## 2019-02-02 MED ORDER — LIDOCAINE HCL (PF) 1 % IJ SOLN
INTRAMUSCULAR | Status: AC
  Filled 2019-02-02: qty 30

## 2019-02-02 MED ORDER — ACETAMINOPHEN 650 MG RE SUPP: 650.0000 mg | Freq: Four times a day (QID) | RECTAL | Status: DC | PRN

## 2019-02-02 MED ORDER — FAMOTIDINE 20 MG PO TABS
20.0000 mg | ORAL_TABLET | Freq: Once | ORAL | Status: AC
  Administered 2019-02-02: 20 mg via ORAL

## 2019-02-02 MED ORDER — BUPIVACAINE HCL (PF) 0.5 % IJ SOLN
INTRAMUSCULAR | Status: AC
  Filled 2019-02-02: qty 30

## 2019-02-02 MED ORDER — FENTANYL CITRATE (PF) 100 MCG/2ML IJ SOLN
25.0000 ug | INTRAMUSCULAR | Status: DC | PRN
  Administered 2019-02-02 (×3): 25 ug via INTRAVENOUS

## 2019-02-02 MED ORDER — ONDANSETRON HCL 4 MG/2ML IJ SOLN
INTRAMUSCULAR | Status: DC | PRN
  Administered 2019-02-02: 4 mg via INTRAVENOUS

## 2019-02-02 MED ORDER — DOCUSATE SODIUM 100 MG PO CAPS
100.0000 mg | ORAL_CAPSULE | Freq: Two times a day (BID) | ORAL | Status: DC
  Administered 2019-02-02 – 2019-02-03 (×2): 100 mg via ORAL
  Filled 2019-02-02 (×3): qty 1

## 2019-02-02 MED ORDER — SORBITOL 70 % SOLN: 30.0000 mL | Freq: Every day | Status: DC | PRN

## 2019-02-02 MED ORDER — ROCURONIUM BROMIDE 100 MG/10ML IV SOLN
INTRAVENOUS | Status: DC | PRN
  Administered 2019-02-02: 10 mg via INTRAVENOUS
  Administered 2019-02-02: 30 mg via INTRAVENOUS
  Administered 2019-02-02: 10 mg via INTRAVENOUS

## 2019-02-02 MED ORDER — DILTIAZEM HCL 60 MG PO TABS
60.0000 mg | ORAL_TABLET | Freq: Four times a day (QID) | ORAL | Status: DC
  Administered 2019-02-02 – 2019-02-03 (×5): 60 mg via ORAL
  Filled 2019-02-02 (×6): qty 1

## 2019-02-02 MED ORDER — KETOROLAC TROMETHAMINE 15 MG/ML IJ SOLN
15.0000 mg | Freq: Three times a day (TID) | INTRAMUSCULAR | Status: AC
  Administered 2019-02-02 – 2019-02-03 (×3): 15 mg via INTRAVENOUS
  Filled 2019-02-02 (×2): qty 1

## 2019-02-02 MED ORDER — FENTANYL CITRATE (PF) 100 MCG/2ML IJ SOLN
INTRAMUSCULAR | Status: AC
  Administered 2019-02-02: 25 ug via INTRAVENOUS
  Filled 2019-02-02: qty 2

## 2019-02-02 MED ORDER — ONDANSETRON HCL 4 MG/2ML IJ SOLN: 4.0000 mg | Freq: Four times a day (QID) | INTRAMUSCULAR | Status: DC | PRN

## 2019-02-02 MED ORDER — ASPIRIN EC 81 MG PO TBEC
81.0000 mg | DELAYED_RELEASE_TABLET | Freq: Every day | ORAL | Status: DC
  Administered 2019-02-02 – 2019-02-03 (×2): 81 mg via ORAL
  Filled 2019-02-02 (×2): qty 1

## 2019-02-02 MED ORDER — LIDOCAINE HCL (CARDIAC) PF 100 MG/5ML IV SOSY
PREFILLED_SYRINGE | INTRAVENOUS | Status: DC | PRN
  Administered 2019-02-02: 100 mg via INTRAVENOUS

## 2019-02-02 MED ORDER — DEXTROSE-NACL 5-0.9 % IV SOLN: INTRAVENOUS | Status: DC

## 2019-02-02 MED ORDER — SODIUM CHLORIDE FLUSH 0.9 % IV SOLN
INTRAVENOUS | Status: AC
  Filled 2019-02-02: qty 10

## 2019-02-02 MED ORDER — ONDANSETRON HCL 4 MG PO TABS: 4.0000 mg | ORAL_TABLET | Freq: Four times a day (QID) | ORAL | Status: DC | PRN

## 2019-02-02 MED ORDER — KETAMINE HCL 50 MG/ML IJ SOLN
INTRAMUSCULAR | Status: AC
  Filled 2019-02-02: qty 10

## 2019-02-02 MED ORDER — DEXAMETHASONE SODIUM PHOSPHATE 10 MG/ML IJ SOLN
INTRAMUSCULAR | Status: DC | PRN
  Administered 2019-02-02: 10 mg via INTRAVENOUS

## 2019-02-02 MED ORDER — SUCCINYLCHOLINE CHLORIDE 20 MG/ML IJ SOLN
INTRAMUSCULAR | Status: DC | PRN
  Administered 2019-02-02: 100 mg via INTRAVENOUS

## 2019-02-02 MED ORDER — FAMOTIDINE 20 MG PO TABS
ORAL_TABLET | ORAL | Status: AC
  Filled 2019-02-02: qty 1

## 2019-02-02 MED ORDER — FENTANYL CITRATE (PF) 100 MCG/2ML IJ SOLN
INTRAMUSCULAR | Status: AC
  Filled 2019-02-02: qty 2

## 2019-02-02 MED ORDER — KETAMINE HCL 50 MG/ML IJ SOLN
INTRAMUSCULAR | Status: DC | PRN
  Administered 2019-02-02: 50 mg via INTRAMUSCULAR

## 2019-02-02 MED ORDER — MAGNESIUM HYDROXIDE 400 MG/5ML PO SUSP: 30.0000 mL | Freq: Every day | ORAL | Status: DC | PRN

## 2019-02-02 MED ORDER — LACTATED RINGERS IV SOLN
INTRAVENOUS | Status: DC
  Administered 2019-02-02: 07:00:00 via INTRAVENOUS

## 2019-02-02 MED ORDER — FENTANYL CITRATE (PF) 100 MCG/2ML IJ SOLN
INTRAMUSCULAR | Status: DC | PRN
  Administered 2019-02-02: 25 ug via INTRAVENOUS
  Administered 2019-02-02: 50 ug via INTRAVENOUS
  Administered 2019-02-02: 25 ug via INTRAVENOUS

## 2019-02-02 MED ORDER — ADULT MULTIVITAMIN W/MINERALS CH
1.0000 | ORAL_TABLET | Freq: Every day | ORAL | Status: DC
  Administered 2019-02-02 – 2019-02-03 (×2): 1 via ORAL
  Filled 2019-02-02 (×2): qty 1

## 2019-02-02 MED ORDER — CEFAZOLIN SODIUM-DEXTROSE 1-4 GM/50ML-% IV SOLN
1.0000 g | Freq: Four times a day (QID) | INTRAVENOUS | Status: AC
  Administered 2019-02-02 – 2019-02-03 (×3): 1 g via INTRAVENOUS
  Filled 2019-02-02 (×3): qty 50

## 2019-02-02 MED ORDER — NALOXONE HCL 2 MG/2ML IJ SOSY
0.1000 mg | PREFILLED_SYRINGE | Freq: Once | INTRAMUSCULAR | Status: AC
  Administered 2019-02-02: 0.1 mg via INTRAVENOUS

## 2019-02-02 MED ORDER — NALOXONE HCL 2 MG/2ML IJ SOSY
PREFILLED_SYRINGE | INTRAMUSCULAR | Status: AC
  Administered 2019-02-02: 0.1 mg via INTRAVENOUS
  Filled 2019-02-02: qty 2

## 2019-02-02 SURGICAL SUPPLY — 41 items
BANDAGE ELASTIC 6 LF NS (GAUZE/BANDAGES/DRESSINGS) ×2 IMPLANT
BLADE OSCILLATING/SAGITTAL (BLADE) ×1
BLADE SW THK.38XMED LNG THN (BLADE) ×1 IMPLANT
BNDG COHESIVE 4X5 TAN STRL (GAUZE/BANDAGES/DRESSINGS) ×2 IMPLANT
BNDG GAUZE 4.5X4.1 6PLY STRL (MISCELLANEOUS) ×4 IMPLANT
BRUSH SCRUB EZ  4% CHG (MISCELLANEOUS) ×1
BRUSH SCRUB EZ 4% CHG (MISCELLANEOUS) ×1 IMPLANT
CANISTER SUCT 1200ML W/VALVE (MISCELLANEOUS) ×2 IMPLANT
CHLORAPREP W/TINT 26 (MISCELLANEOUS) ×2 IMPLANT
COVER WAND RF STERILE (DRAPES) ×2 IMPLANT
ELECT CAUTERY BLADE 6.4 (BLADE) ×2 IMPLANT
ELECT REM PT RETURN 9FT ADLT (ELECTROSURGICAL) ×2
ELECTRODE REM PT RTRN 9FT ADLT (ELECTROSURGICAL) ×1 IMPLANT
GAUZE XEROFORM 1X8 LF (GAUZE/BANDAGES/DRESSINGS) ×4 IMPLANT
GLOVE BIO SURGEON STRL SZ7 (GLOVE) ×4 IMPLANT
GLOVE INDICATOR 7.5 STRL GRN (GLOVE) ×2 IMPLANT
GOWN STRL REUS W/ TWL LRG LVL3 (GOWN DISPOSABLE) ×2 IMPLANT
GOWN STRL REUS W/ TWL XL LVL3 (GOWN DISPOSABLE) ×1 IMPLANT
GOWN STRL REUS W/TWL LRG LVL3 (GOWN DISPOSABLE) ×2
GOWN STRL REUS W/TWL XL LVL3 (GOWN DISPOSABLE) ×1
HANDLE YANKAUER SUCT BULB TIP (MISCELLANEOUS) ×2 IMPLANT
KIT TURNOVER KIT A (KITS) ×2 IMPLANT
LABEL OR SOLS (LABEL) ×2 IMPLANT
NS IRRIG 1000ML POUR BTL (IV SOLUTION) ×2 IMPLANT
PACK EXTREMITY ARMC (MISCELLANEOUS) ×2 IMPLANT
PAD ABD DERMACEA PRESS 5X9 (GAUZE/BANDAGES/DRESSINGS) ×4 IMPLANT
PAD PREP 24X41 OB/GYN DISP (PERSONAL CARE ITEMS) ×2 IMPLANT
SPONGE LAP 18X18 RF (DISPOSABLE) ×3 IMPLANT
STAPLER SKIN PROX 35W (STAPLE) IMPLANT
STOCKINETTE M/LG 89821 (MISCELLANEOUS) ×2 IMPLANT
SUT ETHILON 3-0 FS-10 30 BLK (SUTURE) ×4
SUT ETHILON 4 0 P 3 18 (SUTURE) ×1 IMPLANT
SUT SILK 2 0 (SUTURE) ×1
SUT SILK 2 0 SH (SUTURE) IMPLANT
SUT SILK 2-0 18XBRD TIE 12 (SUTURE) ×1 IMPLANT
SUT SILK 3 0 (SUTURE) ×1
SUT SILK 3-0 18XBRD TIE 12 (SUTURE) ×1 IMPLANT
SUT VIC AB 0 CT1 36 (SUTURE) ×4 IMPLANT
SUT VIC AB 2-0 CT1 (SUTURE) ×4 IMPLANT
SUT VICRYL+ 3-0 36IN CT-1 (SUTURE) ×1 IMPLANT
SUTURE EHLN 3-0 FS-10 30 BLK (SUTURE) ×2 IMPLANT

## 2019-02-02 NOTE — Progress Notes (Signed)
Spoke with Dr. Wyn Quaker to update on pt condition. Dr. Wyn Quaker understands pt has removed pulse ox, tele leads, and SCD.  He will put in order for hospitalist evaluation in PACU and probable safety sitter. No further orders for nurse at this time.

## 2019-02-02 NOTE — Progress Notes (Signed)
Orders given per MD to discontinue safety sitter. Orders followed and placed. Will continue to monitor.

## 2019-02-02 NOTE — Progress Notes (Signed)
Continued telemetry order per MD. Orders followed.

## 2019-02-02 NOTE — Anesthesia Post-op Follow-up Note (Signed)
Anesthesia QCDR form completed.        

## 2019-02-02 NOTE — Progress Notes (Signed)
PT Cancellation Note  Patient Details Name: Krystal Herrera MRN: [id number] DOB: 09/18/1940   Cancelled Treatment:    Reason Eval/Treat Not Completed: Medical issues which prohibited therapy(Chart reviewed, several medical issues precluding PT evalaution at this time. ) INR slightly elevated outside of range for working with PT OOB. Pt also has had postoperative agitation and is awaiting evaluation from hospitalist. PT will continue to monitor remotely and plan for evaluation on POD1 as per current protocol.   3:37 PM, 02/02/19 Rosamaria Lints, PT, DPT Physical Therapist - [facility]  [phone] (ASCOM)   Krystal Herrera C 02/02/2019, 3:36 PM

## 2019-02-02 NOTE — Op Note (Signed)
OPERATIVE NOTE   PROCEDURE: Right transmetatarsal amputation  PRE-OPERATIVE DIAGNOSIS: Right forefoot gangrene  POST-OPERATIVE DIAGNOSIS: same as above  SURGEON: Festus BarrenJason Dew, MD  ASSISTANT(S): kim Stegmayer, PA-C  ANESTHESIA: general  ESTIMATED BLOOD LOSS: 50 cc  FINDING(S): none  SPECIMEN(S):  Right transmetatarsal amputation   INDICATIONS:   Called patient. Advised patient of provider's approval for requested procedure, as well as any comments/instructions from provider.   Provided patient w/ verbal instructions concerning pre-, intra- and post-procedure preparation and instructions.  Patient verbalized understanding of the above.    Patient is a 78 y.o.female who presents with gangrenous right forefoot.  The patient is scheduled for a right transmetatarsal amputation.  I discussed in depth with the patient the risks, benefits, and alternatives to this procedure.  The patient is aware that the risk of this operation included but are not limited to:  bleeding, infection, myocardial infarction, stroke, death, failure to heal amputation wound, and possible need for more proximal amputation.  The patient is aware of the risks and agrees proceed forward with the procedure. An assistant was present during the procedure to help facilitate the exposure and expedite the procedure.   DESCRIPTION:  After full informed written consent was obtained from the patient, the patient was brought back to the operating room, and placed supine upon the operating table.  Prior to induction, the patient received IV antibiotics.  The patient was then prepped and draped in the standard fashion for a right transmetatarsal amputation.  The assistant provided retraction and mobilization to help facilitate exposure and expedite the procedure throughout the entire procedure.  This included following suture, using retractors, and optimizing lighting. After obtaining adequate anesthesia, the patient was  prepped and draped in the standard fashion.  I marked out the skin incision at the base of the toes with a fish mouth approach laterally and medially to allow a tension free closure with the plantar side being taken almost all the way to the toes and the top of the foot having to be extended more proximally due to the extent of the necrosis.  An incision was created with a 15 blade and taken to the bone with electrocautery.  The toes were then dissected off of the MTP joins and freed for removal.  The bovie and the periostial elevator were used to dissect out the transmetatarsal bones about 5 cm proximally and then all five of these were transected with the TPS saw.  This had to be beveled and higher than normal on the dorsal side due to the extent of the necrosis.  Fortunately, the plantar side was reasonably healthy all the way to the base of the toes allowing for a tension-free closure.  The fist metatarsal was taken back about another cm to allow a tension free closure. Ronjours were used to remove any sharp or loose pieces of bone.  At this point, the specimen was passed off the field as right transmetatarsal amputation.  At this point, I clamped all visibly bleeding arteries and veins using a combination of suture ligation with Silk suture and electrocautery.  Bleeding continued to be controlled with electrocautery and suture ligature.  The stump was washed off with sterile normal saline and no further active bleeding was noted.  I reapproximated the anterior and posterior fascia  with interrupted stitches of 0 Vicryl.  This was completed along the entire length of anterior and posterior fascia until there were no more loose space in the fascial line. A layer of  2-0 Vicryl interrupted sutures were used to complete the closure of the deep space. I then placed a layer of 3-0 Vicryl sutures in the subcutaneous tissue. The skin was then  reapproximated with a series of mattress 3-0 Nylon sutures.  The stump was  washed off and dried.  The incision was dressed with Xeroform and  then fluffs were applied.  Kerlix was wrapped around the foot.  He was then taken to the recovery room in stable condition.  COMPLICATIONS: none  CONDITION: stable   Festus Barren 02/02/2019 8:36 AM  This note was created with Dragon Medical transcription system. Any errors in dictation are purely unintentional.

## 2019-02-02 NOTE — Anesthesia Preprocedure Evaluation (Addendum)
Anesthesia Evaluation  Patient identified by MRN, date of birth, ID band Patient awake    Reviewed: Allergy & Precautions, H&P , NPO status , Patient's Chart, lab work & pertinent test results  History of Anesthesia Complications (+) history of anesthetic complications (1985 "BP, HR and breathing bottomed out")  Airway Mallampati: III  TM Distance: >3 FB     Dental  (+) Missing, Poor Dentition, Chipped Very few teeth:   Pulmonary shortness of breath, COPD, Current Smoker,           Cardiovascular hypertension, + Peripheral Vascular Disease  (-) Past MI, (-) Cardiac Stents and (-) CABG + dysrhythmias Atrial Fibrillation   May 2020 echo 1. The left ventricle has low normal systolic function, with an ejection fraction of 50-55%. The cavity size was normal. Left ventricular diastolic Doppler parameters are indeterminate.  2. The right ventricle has normal systolic function. The cavity was normal. There is no increase in right ventricular wall thickness.Unable to estimate RVSP  3. Left atrial size was mildly dilated.  4. Rhythm is atrial fibrillation   Neuro/Psych PSYCHIATRIC DISORDERS Anxiety CVA, Residual Symptoms    GI/Hepatic negative GI ROS, Neg liver ROS,   Endo/Other  negative endocrine ROS  Renal/GU      Musculoskeletal   Abdominal   Peds  Hematology negative hematology ROS (+)   Anesthesia Other Findings Past Medical History: No date: Anxiety No date: Arthritis No date: Bronchitis     Comment:  CHRONIC 1985: Complication of anesthesia     Comment:  bp, hr and breathing bottomed out during surgery-WITH               THYROID SURGERY 01/21/2019: Dysrhythmia No date: Hypertension     Comment:  NO MEDS No date: Stroke (HCC)     Comment:  HUSBAND ASSUMES POSSIBLE STROKE LAST MONTH BUT UNSURE-PT              HAS NUMBNESS IN RIGHT ARM HAND   Past Surgical History: 01/22/2019: LOWER EXTREMITY ANGIOGRAPHY; Right    Comment:  Procedure: LOWER EXTREMITY ANGIOGRAPHY;  Surgeon: Annice Needy, MD;  Location: [facility] INVASIVE CV LAB;  Service:               Cardiovascular;  Laterality: Right; No date: THYROID SURGERY No date: THYROIDECTOMY, PARTIAL No date: TUBAL LIGATION     Reproductive/Obstetrics negative OB ROS                           Anesthesia Physical Anesthesia Plan  ASA: III  Anesthesia Plan: General ETT and Modified Rapid Sequence   Post-op Pain Management:    Induction:   PONV Risk Score and Plan: Dexamethasone, Ondansetron and Treatment may vary due to age or medical condition  Airway Management Planned:   Additional Equipment:   Intra-op Plan:   Post-operative Plan:   Informed Consent: I have reviewed the patients History and Physical, chart, labs and discussed the procedure including the risks, benefits and alternatives for the proposed anesthesia with the patient or authorized representative who has indicated his/her understanding and acceptance.     Dental Advisory Given  Plan Discussed with: Anesthesiologist and CRNA  Anesthesia Plan Comments:       Anesthesia Quick Evaluation

## 2019-02-02 NOTE — Anesthesia Procedure Notes (Signed)

## 2019-02-02 NOTE — Anesthesia Postprocedure Evaluation (Signed)
Anesthesia Post Note  Patient: Krystal Herrera  Procedure(s) Performed: TRANSMETATARSAL AMPUTATION (Right )  Patient location during evaluation: PACU Anesthesia Type: General Level of consciousness: awake Pain management: pain level controlled Vital Signs Assessment: post-procedure vital signs reviewed and stable Respiratory status: spontaneous breathing, nonlabored ventilation and respiratory function stable Cardiovascular status: blood pressure returned to baseline and stable Postop Assessment: no apparent nausea or vomiting Anesthetic complications: no     Last Vitals:  Vitals:   02/02/19 1007 02/02/19 1013  BP:  116/75  Pulse: 87 (!) 43  Resp: 20 20  Temp:    SpO2: 100% 94%    Last Pain:  Vitals:   02/02/19 0952  TempSrc:   PainSc: 0-No pain                 Jovita Gamma

## 2019-02-02 NOTE — Consult Note (Signed)
PHARMACY CONSULT NOTE - FOLLOW UP  Pharmacy Consult for Electrolyte Monitoring and Replacement   Recent Labs: Potassium (mmol/L)  Date Value  02/02/2019 3.1 (L)   Magnesium (mg/dL)  Date Value  41/42/3953 2.0   Calcium (mg/dL)  Date Value  20/23/3435 7.6 (L)   Sodium (mmol/L)  Date Value  02/02/2019 143   Corrected Ca: no recent albumin on file  Assessment: 78 y.o. female with past medical history of remote CVA, hypertension, ongoing tobacco abuse who presented to the hospital for right transmetatarsal amputation and  preoperatively was noted to be in atrial fibrillation with hypokalemia noted on labs  Goal of Therapy:  K~4 mmol/L, Mg~2 mg/dL  Plan:  --add 40 mEq KCL to D5NS at 75 mL/hr --give additional 40 mEq oral KCl --CMP in am to re-evaluate  Lowella Bandy ,PharmD Clinical Pharmacist 02/02/2019 3:20 PM

## 2019-02-02 NOTE — Transfer of Care (Signed)
Immediate Anesthesia Transfer of Care Note  Patient: Kenitra Espino  Procedure(s) Performed: TRANSMETATARSAL AMPUTATION (Right )  Patient Location: PACU  Anesthesia Type:General  Level of Consciousness: sedated  Airway & Oxygen Therapy: Patient Spontanous Breathing and Patient connected to face mask oxygen  Post-op Assessment: Report given to RN and Post -op Vital signs reviewed and stable  Post vital signs: Reviewed and stable  Last Vitals:  Vitals Value Taken Time  BP 114/68 02/02/2019  9:07 AM  Temp 36.9 C 02/02/2019  9:07 AM  Pulse 52 02/02/2019  9:09 AM  Resp 13 02/02/2019  9:09 AM  SpO2 97 % 02/02/2019  9:09 AM  Vitals shown include unvalidated device data.  Last Pain:  Vitals:   02/02/19 0907  TempSrc:   PainSc: 0-No pain         Complications: No apparent anesthesia complications

## 2019-02-02 NOTE — H&P (Signed)
 [facility] History & Physical Update  The patient was interviewed and re-examined.  The patient's previous History and Physical has been reviewed and is unchanged.  There is no change in the plan of care. We plan to proceed with the scheduled procedure.  Festus Barren, MD  02/02/2019, 7:32 AM

## 2019-02-02 NOTE — Progress Notes (Signed)
Pt in PACU not speaking, slight response to voice. Dr. Noralyn Pick at bedside ordered 0.1 narcan. Narcan given at [phone]. At 1000, pt began yelling, pulling off leads and pulse ox, trying to get out of bed.  Grabbed at nurse. Dr. Noralyn Pick paged. MD at bedside, ordered 25mg  fentanyl; given at 1003. Pt not oriented to self, time, or situation.  Dr. Noralyn Pick at bedside at 1024 to reevaluate. No other medications ordered at this time. Will continue to evaluate in PACU.

## 2019-02-03 DIAGNOSIS — Z89431 Acquired absence of right foot: Secondary | ICD-10-CM

## 2019-02-03 DIAGNOSIS — I96 Gangrene, not elsewhere classified: Secondary | ICD-10-CM

## 2019-02-03 LAB — PROTIME-INR
INR: 2.6 — ABNORMAL HIGH (ref 0.8–1.2)
Prothrombin Time: 27.3 seconds — ABNORMAL HIGH (ref 11.4–15.2)

## 2019-02-03 LAB — COMPREHENSIVE METABOLIC PANEL
ALT: 10 U/L (ref 0–44)
AST: 18 U/L (ref 15–41)
Albumin: 2.6 g/dL — ABNORMAL LOW (ref 3.5–5.0)
Alkaline Phosphatase: 84 U/L (ref 38–126)
Anion gap: 9 (ref 5–15)
BUN: 39 mg/dL — ABNORMAL HIGH (ref 8–23)
CO2: 21 mmol/L — ABNORMAL LOW (ref 22–32)
Calcium: 7.3 mg/dL — ABNORMAL LOW (ref 8.9–10.3)
Chloride: 107 mmol/L (ref 98–111)
Creatinine, Ser: 1.08 mg/dL — ABNORMAL HIGH (ref 0.44–1.00)
GFR calc Af Amer: 57 mL/min — ABNORMAL LOW (ref >60)
GFR calc non Af Amer: 49 mL/min — ABNORMAL LOW (ref >60)
Glucose, Bld: 142 mg/dL — ABNORMAL HIGH (ref 70–99)
Potassium: 3.7 mmol/L (ref 3.5–5.1)
Sodium: 137 mmol/L (ref 135–145)
Total Bilirubin: 0.4 mg/dL (ref 0.3–1.2)
Total Protein: 5.7 g/dL — ABNORMAL LOW (ref 6.5–8.1)

## 2019-02-03 LAB — CBC
HCT: 21.9 % — ABNORMAL LOW (ref 36.0–46.0)
Hemoglobin: 7.2 g/dL — ABNORMAL LOW (ref 12.0–15.0)
MCH: 32.6 pg (ref 26.0–34.0)
MCHC: 32.9 g/dL (ref 30.0–36.0)
MCV: 99.1 fL (ref 80.0–100.0)
Platelets: 246 10*3/uL (ref 150–400)
RBC: 2.21 MIL/uL — ABNORMAL LOW (ref 3.87–5.11)
RDW: 18.4 % — ABNORMAL HIGH (ref 11.5–15.5)
WBC: 13.8 10*3/uL — ABNORMAL HIGH (ref 4.0–10.5)
nRBC: 0.2 % (ref 0.0–0.2)

## 2019-02-03 LAB — MAGNESIUM: Magnesium: 2 mg/dL (ref 1.7–2.4)

## 2019-02-03 LAB — APTT: aPTT: 43 seconds — ABNORMAL HIGH (ref 24–36)

## 2019-02-03 MED ORDER — TRAMADOL HCL 50 MG PO TABS: 50.0000 mg | ORAL_TABLET | Freq: Three times a day (TID) | ORAL | 0 refills | Status: DC | PRN

## 2019-02-03 NOTE — Consult Note (Signed)
PHARMACY CONSULT NOTE - FOLLOW UP  Pharmacy Consult for Electrolyte Monitoring and Replacement   Recent Labs: Potassium (mmol/L)  Date Value  02/03/2019 3.7   Magnesium (mg/dL)  Date Value  01/00/7121 2.0   Calcium (mg/dL)  Date Value  97/58/8325 7.3 (L)   Albumin (g/dL)  Date Value  49/82/6415 2.6 (L)   Sodium (mmol/L)  Date Value  02/03/2019 137   Corrected Ca: ~8.5  Assessment: 78 y.o. female with past medical history of remote CVA, hypertension, ongoing tobacco abuse who presented to the hospital for right transmetatarsal amputation and  preoperatively was noted to be in atrial fibrillation with hypokalemia noted on labs  Goal of Therapy:  K~4 mmol/L, Mg~2 mg/dL  Plan:  --Continue 40 mEq KCL with D5NS at 75 mL/hr - Recheck BMP with AM labs - may need to D/C KCl in fluids tomorrow am  Albina Billet, PharmD, BCPS Clinical Pharmacist 02/03/2019 7:14 AM

## 2019-02-03 NOTE — Progress Notes (Signed)
Krystal Herrera to be D/C'd home per MD order.  Discussed prescriptions and follow up appointments with the patient. Prescriptions given to patient, medication list explained in detail. Pt verbalized understanding.  Allergies as of 02/03/2019      Reactions   Hydrocodone-acetaminophen Swelling   Itching to arms and swelling to face and lips, improved with benadryl. HUSBAND STATES ALL NARCOTICS AFFECT PT THIS WAY      Medication List    STOP taking these medications   amoxicillin 125 MG chewable tablet Commonly known as:  AMOXIL   nicotine 21 mg/24hr patch Commonly known as:  NICODERM CQ - dosed in mg/24 hours     TAKE these medications   apixaban 5 MG Tabs tablet Commonly known as:  ELIQUIS Take 1 tablet (5 mg total) by mouth 2 (two) times daily.   aspirin EC 81 MG tablet Take 81 mg by mouth daily.   atorvastatin 10 MG tablet Commonly known as:  LIPITOR Take 1 tablet (10 mg total) by mouth daily at 6 PM.   diltiazem 60 MG tablet Commonly known as:  CARDIZEM Take 1 tablet (60 mg total) by mouth every 6 (six) hours.   traMADol 50 MG tablet Commonly known as:  ULTRAM Take 1 tablet (50 mg total) by mouth every 8 (eight) hours as needed for moderate pain or severe pain.       Vitals:   02/03/19 0414 02/03/19 1157  BP: 115/78 109/67  Pulse: 79 63  Resp: 16 16  Temp: 98.4 F (36.9 C) 97.7 F (36.5 C)  SpO2: 97% 99%    Skin clean, dry and intact without evidence of skin break down, no evidence of skin tears noted. IV catheter discontinued intact. Site without signs and symptoms of complications. Dressing and pressure applied. Pt denies pain at this time. No complaints noted.  An After Visit Summary was printed and given to the patient. Patient escorted via WC, and D/C home via private auto.  Cecil Cobbs RN [facility] [address] Phone [phone]

## 2019-02-03 NOTE — Evaluation (Signed)
Occupational Therapy Evaluation Patient Details Name: Krystal Herrera MRN: [id number] DOB: 1940/11/05 Today's Date: 02/03/2019    History of Present Illness 78yo female pt POD1 s/p R transmetatarsal amputation for gangrene on 02/02/19. PMHx includes anxiety, arthritis, chronic bronchitis, dysrhythmia, HTN, CVA.    Clinical Impression   Pt seen for OT evaluation this date. Prior to hospital admission, pt was ambulating with a RW and indep with basic ADL. 2/2 legal blindness, spouse assists with medication mgt and driving as well as meal prep and housekeeping tasks.  Pt lives in a 1 story home with 2 steps to enter.  Currently pt demonstrates impairments in strength and balance, R foot in post-op shoe requiring CGA to Min assist for LB ADL and CGA for limited functional mobility. Pt instructed in home/routines modifications, falls prevention strategies. Pt would benefit from skilled OT to address noted impairments and functional limitations (see below for any additional details) in order to maximize safety and independence while minimizing falls risk and caregiver burden.  Upon hospital discharge, recommend pt discharge home with [facility] services.    Follow Up Recommendations  Home health OT    Equipment Recommendations  3 in 1 bedside commode    Recommendations for Other Services       Precautions / Restrictions Precautions Precautions: Fall Required Braces or Orthoses: Other Brace Other Brace: post-op shoe Restrictions Weight Bearing Restrictions: No Other Position/Activity Restrictions: orders for up with assistance and falls precautions, no specific weightbearing precautions noted      Mobility Bed Mobility               General bed mobility comments: deferred, up in recliner at start and end of session  Transfers Overall transfer level: Needs assistance Equipment used: Rolling walker (2 wheeled) Transfers: Sit to/from Stand Sit to Stand: Min guard               Balance Overall balance assessment: Needs assistance Sitting-balance support: Feet supported;Feet unsupported;No upper extremity supported Sitting balance-Leahy Scale: Good       Standing balance-Leahy Scale: Poor Standing balance comment: requires UE support on RW                           ADL either performed or assessed with clinical judgement   ADL Overall ADL's : Needs assistance/impaired Eating/Feeding: Independent;Sitting   Grooming: Sitting;Independent   Upper Body Bathing: Sitting;Supervision/ safety;Set up   Lower Body Bathing: Sit to/from stand;Minimal assistance   Upper Body Dressing : Sitting;Set up;Supervision/safety   Lower Body Dressing: Sit to/from stand;Minimal assistance Lower Body Dressing Details (indicate cue type and reason): Min A for RLE mgt during dressing Toilet Transfer: Min Social worker Details (indicate cue type and reason): minimal weightbearing through RLE                 Vision Baseline Vision/History: Legally blind Patient Visual Report: No change from baseline       Perception     Praxis      Pertinent Vitals/Pain Pain Assessment: No/denies pain     Hand Dominance Right   Extremity/Trunk Assessment Upper Extremity Assessment Upper Extremity Assessment: Overall WFL for tasks assessed   Lower Extremity Assessment Lower Extremity Assessment: RLE deficits/detail RLE Deficits / Details: post-op shoe on RLE: Unable to fully assess due to pain;Unable to fully assess due to immobilization   Cervical / Trunk Assessment Cervical / Trunk Assessment: Normal   Communication Communication Communication: No difficulties  Cognition Arousal/Alertness: Awake/alert Behavior During Therapy: WFL for tasks assessed/performed Overall Cognitive Status: Within Functional Limits for tasks assessed                                     General Comments  post-op shoe on, dressing dry  and intact    Exercises Other Exercises Other Exercises: pt instructed in home/routines modifications, falls prevention strategies   Shoulder Instructions      Home Living Family/patient expects to be discharged to:: Private residence Living Arrangements: Spouse/significant other Available Help at Discharge: Family;Available 24 hours/day Type of Home: House Home Access: Stairs to enter Entergy CorporationEntrance Stairs-Number of Steps: 2 Entrance Stairs-Rails: None Home Layout: One level     Bathroom Shower/Tub: Producer, television/film/videoWalk-in shower   Bathroom Toilet: Standard     Home Equipment: Environmental consultantWalker - 2 wheels;Cane - single point          Prior Functioning/Environment Level of Independence: Needs assistance  Gait / Transfers Assistance Needed: pt amb with RW primarily  ADL's / Homemaking Assistance Needed: pt indep with basic ADL, spouse assists with IADL and transportation   Comments: pt endorses 2 falls in 12 months 2/2 LOB        OT Problem List: Decreased strength;Impaired balance (sitting and/or standing);Impaired vision/perception;Decreased knowledge of use of DME or AE      OT Treatment/Interventions: Self-care/ADL training;Balance training;Therapeutic exercise;Therapeutic activities;DME and/or AE instruction;Patient/family education    OT Goals(Current goals can be found in the care plan section) Acute Rehab OT Goals Patient Stated Goal: to go home and recover OT Goal Formulation: With patient Time For Goal Achievement: 02/17/19 Potential to Achieve Goals: Good ADL Goals Pt Will Perform Lower Body Dressing: with modified independence;sit to/from stand Pt Will Transfer to Toilet: with supervision;ambulating(LRAD for amb)  OT Frequency: Min 1X/week   Barriers to D/C:            Co-evaluation              AM-PAC OT "6 Clicks" Daily Activity     Outcome Measure Help from another person eating meals?: None Help from another person taking care of personal grooming?: None Help from  another person toileting, which includes using toliet, bedpan, or urinal?: A Little Help from another person bathing (including washing, rinsing, drying)?: A Little Help from another person to put on and taking off regular upper body clothing?: None Help from another person to put on and taking off regular lower body clothing?: A Little 6 Click Score: 21   End of Session Equipment Utilized During Treatment: Gait belt;Rolling walker  Activity Tolerance: Patient tolerated treatment well Patient left: in chair;with call bell/phone within reach;with chair alarm set  OT Visit Diagnosis: Other abnormalities of gait and mobility (R26.89);History of falling (Z91.81);Muscle weakness (generalized) (M62.81)                Time: [id number] OT Time Calculation (min): 27 min Charges:  OT General Charges $OT Visit: 1 Visit OT Evaluation $OT Eval Low Complexity: 1 Low OT Treatments $Self Care/Home Management : 8-22 mins  Richrd PrimeJamie Stiller, MPH, MS, OTR/L ascom [phone] 02/03/19, 2:23 PM

## 2019-02-03 NOTE — Evaluation (Signed)
Physical Therapy Evaluation Patient Details Name: Krystal Herrera MRN: [id number] DOB: July 08, 1941 Today's Date: 02/03/2019   History of Present Illness  78yo female pt POD1 s/p R transmetatarsal amputation for gangrene on 02/02/19. PMHx includes anxiety, arthritis, chronic bronchitis, dysrhythmia, HTN, CVA.   Clinical Impression  Upon evaluation, patient alert and oriented; preparing for discharge with family once husband arrives.  R LE bandaged in ace wrap, post op shoe in place.  Somewhat impulsive with movement transitions, but responds well to verbal cuing/redirection.  Currently able to complete sit/stand, transfers and gait (12') with RW, cga/min assist.  Educated in 3-point, step to gait pattern to promote optimal adherence to heel WBing R LE.  Slight instabily with gait efforts (improved with education); continue to recommend use of RW and +1 assist at all times. Patient voiced understanding/agreement with all information provided. Would benefit from skilled PT to address above deficits and promote optimal return to PLOF; Recommend transition to HHPT upon discharge from acute hospitalization.     Follow Up Recommendations Home health PT    Equipment Recommendations  (has RW)    Recommendations for Other Services       Precautions / Restrictions Precautions Precautions: Fall Required Braces or Orthoses: Other Brace Other Brace: post-op shoe Restrictions Weight Bearing Restrictions: No Other Position/Activity Restrictions: orders for up with assistance and falls precautions, no specific weightbearing precautions noted      Mobility  Bed Mobility               General bed mobility comments: seated in recliner beginning/end of treatment session  Transfers Overall transfer level: Needs assistance Equipment used: Rolling walker (2 wheeled) Transfers: Sit to/from Stand Sit to Stand: Min guard;Min assist         General transfer comment: educated in hand placement,  sequencing for optimal safety  Ambulation/Gait Ambulation/Gait assistance: Min guard;Min Chemical engineerassist Gait Distance (Feet): 12 Feet Assistive device: Rolling walker (2 wheeled)       General Gait Details: 3-point, step to gait pattern with cuing for WBing through bilat UEs to offset R LE; slow and guarded, impaired balance at times.  Recommend use of RW and +1 at all times.  Instructed in household distances only; emphasis on R heel wBing as able  Stairs            Wheelchair Mobility    Modified Rankin (Stroke Patients Only)       Balance Overall balance assessment: Needs assistance Sitting-balance support: No upper extremity supported;Feet supported Sitting balance-Leahy Scale: Good     Standing balance support: Bilateral upper extremity supported Standing balance-Leahy Scale: Poor Standing balance comment: requires UE support on RW                             Pertinent Vitals/Pain Pain Assessment: Faces Faces Pain Scale: Hurts a little bit Pain Location: R foot Pain Descriptors / Indicators: Aching Pain Intervention(s): Limited activity within patient's tolerance;Monitored during session;Repositioned    Home Living Family/patient expects to be discharged to:: Private residence Living Arrangements: Spouse/significant other Available Help at Discharge: Family;Available 24 hours/day Type of Home: House Home Access: Stairs to enter Entrance Stairs-Rails: None Entrance Stairs-Number of Steps: 2 Home Layout: One level Home Equipment: Walker - 2 wheels;Cane - single point      Prior Function Level of Independence: Needs assistance   Gait / Transfers Assistance Needed: pt amb with RW primarily   ADL's / Homemaking Assistance Needed: pt  indep with basic ADL, spouse assists with IADL and transportation  Comments: Mod indep with RW for ADLs, household mobilization; husband assists with IADLs, transportation; endorses 2 falls with in previous year      Hand Dominance   Dominant Hand: Right    Extremity/Trunk Assessment   Upper Extremity Assessment Upper Extremity Assessment: Overall WFL for tasks assessed    Lower Extremity Assessment Lower Extremity Assessment: Overall WFL for tasks assessed(grossly 4-/5 throughout; ace wrap to R foot, post op shoe in place) RLE Deficits / Details: post-op shoe on RLE: Unable to fully assess due to pain;Unable to fully assess due to immobilization    Cervical / Trunk Assessment Cervical / Trunk Assessment: Normal  Communication   Communication: No difficulties  Cognition Arousal/Alertness: Awake/alert Behavior During Therapy: WFL for tasks assessed/performed Overall Cognitive Status: Within Functional Limits for tasks assessed                                        General Comments General comments (skin integrity, edema, etc.): post-op shoe on, dressing dry and intact    Exercises Other Exercises Other Exercises: Educated in R LE WBing restrictions, R post-op shoe wearing, gait distance restrictions, stair negotiatino, car transfers; patient voiced understanding.  Will continue to reinforce as needed.   Assessment/Plan    PT Assessment Patient needs continued PT services  PT Problem List Decreased strength;Decreased range of motion;Decreased balance;Decreased activity tolerance;Decreased safety awareness;Decreased mobility;Decreased knowledge of precautions;Decreased skin integrity;Pain       PT Treatment Interventions DME instruction;Gait training;Stair training;Functional mobility training;Therapeutic activities;Therapeutic exercise;Balance training;Patient/family education    PT Goals (Current goals can be found in the Care Plan section)  Acute Rehab PT Goals Patient Stated Goal: to go home and recover PT Goal Formulation: With patient Time For Goal Achievement: 02/17/19 Potential to Achieve Goals: Fair    Frequency 7X/week   Barriers to discharge         Co-evaluation               AM-PAC PT "6 Clicks" Mobility  Outcome Measure Help needed turning from your back to your side while in a flat bed without using bedrails?: A Little Help needed moving from lying on your back to sitting on the side of a flat bed without using bedrails?: A Little Help needed moving to and from a bed to a chair (including a wheelchair)?: A Little Help needed standing up from a chair using your arms (e.g., wheelchair or bedside chair)?: A Little Help needed to walk in hospital room?: A Little Help needed climbing 3-5 steps with a railing? : A Little 6 Click Score: 18    End of Session Equipment Utilized During Treatment: Gait belt Activity Tolerance: Patient tolerated treatment well Patient left: in chair;with call bell/phone within reach;with chair alarm set Nurse Communication: Mobility status PT Visit Diagnosis: Muscle weakness (generalized) (M62.81);Difficulty in walking, not elsewhere classified (R26.2)    Time: 7353-2992 PT Time Calculation (min) (ACUTE ONLY): 34 min   Charges:   PT Evaluation $PT Eval Moderate Complexity: 1 Mod PT Treatments $Gait Training: 8-22 mins $Therapeutic Activity: 8-22 mins        Geneal Huebert H. Manson Passey, PT, DPT, NCS 02/03/19, 5:28 PM [phone]

## 2019-02-03 NOTE — Discharge Summary (Signed)
[facility]    Discharge Summary  Patient ID:  Krystal Herrera MRN: [id number] DOB/AGE: [birth year] 78 y.o.  Admit date: 02/02/2019 Discharge date: 02/03/2019 Date of Surgery: 02/02/2019 Surgeon: Surgeon(s): Dew, Marlow Baars, MD  Admission Diagnosis: GANGRENE RIGHT FOOT  Discharge Diagnoses:  GANGRENE RIGHT FOOT  Secondary Diagnoses: Past Medical History:  Diagnosis Date  . Anxiety   . Arthritis   . Bronchitis    CHRONIC  . Complication of anesthesia 1985   bp, hr and breathing bottomed out during surgery-WITH THYROID SURGERY  . Dysrhythmia 01/21/2019  . Hypertension    NO MEDS  . Stroke (HCC)    HUSBAND ASSUMES POSSIBLE STROKE LAST MONTH BUT UNSURE-PT HAS NUMBNESS IN RIGHT ARM HAND    Procedure(s): TRANSMETATARSAL AMPUTATION (RIGHT)  Discharged Condition: Good  HPI:  The patient is a 78 year old female with multiple medical issues who presented with a gangrenous right forefoot.  On Feb 02 2019, the patient underwent a right metatarsal amputation.  The patient tolerated the procedure well and was transferred from the operating room to the surgical floor without issue.  The patient's night of surgery was unremarkable.  The patient's brief inpatient stay was unremarkable.  The patient's diet was advanced, she was urinating without issue, her pain was controlled with the use of PO pain medication and she was ambulating with assistance upon discharge.  At the time of discharge, the patient was afebrile with stable vital signs.  Hospital Course:  Krystal Herrera is a 78 y.o. female is S/P Right:  Procedure(s): TRANSMETATARSAL AMPUTATION (RIGHT)  Extubated: POD # 0  Physical exam:  A&Ox3, NAD CV: RRR Pulm: CTA Bilaterally Abdomen: Soft, Non-tender, Non-distended, (+) Bowel Sounds Extremity:  Right Lower Extremity: Thigh soft, calf soft. OR dressing intact - clean  and dry.    Post-op wounds clean, dry, intact or healing well  Labs as  below  Complications: None  Consults: None  Significant Diagnostic Studies: CBC Lab Results  Component Value Date   WBC 13.8 (H) 02/03/2019   HGB 7.2 (L) 02/03/2019   HCT 21.9 (L) 02/03/2019   MCV 99.1 02/03/2019   PLT 246 02/03/2019   BMET    Component Value Date/Time   NA 137 02/03/2019 0405   K 3.7 02/03/2019 0405   CL 107 02/03/2019 0405   CO2 21 (L) 02/03/2019 0405   GLUCOSE 142 (H) 02/03/2019 0405   BUN 39 (H) 02/03/2019 0405   CREATININE 1.08 (H) 02/03/2019 0405   CALCIUM 7.3 (L) 02/03/2019 0405   GFRNONAA 49 (L) 02/03/2019 0405   GFRAA 57 (L) 02/03/2019 0405   COAG Lab Results  Component Value Date   INR 2.6 (H) 02/03/2019   INR 3.7 (H) 02/02/2019   Disposition:  Discharge to :Home  Allergies as of 02/03/2019      Reactions   Hydrocodone-acetaminophen Swelling   Itching to arms and swelling to face and lips, improved with benadryl. HUSBAND STATES ALL NARCOTICS AFFECT PT THIS WAY      Medication List    STOP taking these medications   amoxicillin 125 MG chewable tablet Commonly known as:  AMOXIL   nicotine 21 mg/24hr patch Commonly known as:  NICODERM CQ - dosed in mg/24 hours     TAKE these medications   apixaban 5 MG Tabs tablet Commonly known as:  ELIQUIS Take 1 tablet (5 mg total) by mouth 2 (two) times daily.   aspirin EC 81 MG tablet Take 81 mg by mouth daily.  atorvastatin 10 MG tablet Commonly known as:  LIPITOR Take 1 tablet (10 mg total) by mouth daily at 6 PM.   diltiazem 60 MG tablet Commonly known as:  CARDIZEM Take 1 tablet (60 mg total) by mouth every 6 (six) hours.   traMADol 50 MG tablet Commonly known as:  ULTRAM Take 1 tablet (50 mg total) by mouth every 8 (eight) hours as needed for moderate pain or severe pain.      Verbal and written Discharge instructions given to the patient. Wound care per Discharge AVS Follow-up Information    Dew, Marlow BaarsJason S, MD. Schedule an appointment as soon as possible for a visit on  02/09/2019.   Specialties:  Vascular Surgery, Radiology, Interventional Cardiology Why:  As per Dr. Wyn Quakerew. To see Dew or Vivia BirminghamFallon on Tuesday for first post-op dressing change. Patient to make appt due to office being closed on Wednesdays Contact information: [address] [place] [address] [phone]          Signed: Tonette LedererKIMBERLY A Rendy Lazard, PA-C  02/03/2019, 12:07 PM

## 2019-02-03 NOTE — Discharge Instructions (Signed)
[facility] Discharge Instructions 1) Please do not remove your dressing. We will see you on Tuesday and remove it in the office. 2) Please do not stand on your foot. Please wear your orthopedic shoe. Please use your walker / wheelchair to help you ambulate.  3) Elevate your leg. 4) Please only take one daily baby Aspirin (81mg ).  5) Please stop taking your Plavix. 6) Your pain medication has been sent directly to your pharmacy on file.

## 2019-02-04 LAB — SURGICAL PATHOLOGY

## 2019-02-09 ENCOUNTER — Emergency Department
Admission: EM | Admit: 2019-02-09 | Discharge: 2019-02-10 | Disposition: A | Discharge status: Skilled Nursing Facility | Payer: Self-pay | Attending: Emergency Medicine | Admitting: Emergency Medicine

## 2019-02-09 ENCOUNTER — Ambulatory Visit (INDEPENDENT_AMBULATORY_CARE_PROVIDER_SITE_OTHER): Payer: Medicare Other | Admitting: Nurse Practitioner

## 2019-02-09 ENCOUNTER — Other Ambulatory Visit: Payer: Self-pay

## 2019-02-09 ENCOUNTER — Telehealth (INDEPENDENT_AMBULATORY_CARE_PROVIDER_SITE_OTHER): Payer: Self-pay

## 2019-02-09 ENCOUNTER — Emergency Department: Payer: Self-pay

## 2019-02-09 DIAGNOSIS — F17210 Nicotine dependence, cigarettes, uncomplicated: Secondary | ICD-10-CM | POA: Insufficient documentation

## 2019-02-09 DIAGNOSIS — Z7982 Long term (current) use of aspirin: Secondary | ICD-10-CM | POA: Insufficient documentation

## 2019-02-09 DIAGNOSIS — I10 Essential (primary) hypertension: Secondary | ICD-10-CM | POA: Insufficient documentation

## 2019-02-09 DIAGNOSIS — Z79899 Other long term (current) drug therapy: Secondary | ICD-10-CM | POA: Insufficient documentation

## 2019-02-09 DIAGNOSIS — R531 Weakness: Secondary | ICD-10-CM

## 2019-02-09 DIAGNOSIS — Z1159 Encounter for screening for other viral diseases: Secondary | ICD-10-CM | POA: Insufficient documentation

## 2019-02-09 DIAGNOSIS — M6281 Muscle weakness (generalized): Secondary | ICD-10-CM | POA: Insufficient documentation

## 2019-02-09 HISTORY — DX: Unspecified atherosclerosis of native arteries of extremities, right leg: I70.201

## 2019-02-09 HISTORY — DX: Unspecified visual loss: H54.7

## 2019-02-09 LAB — SARS CORONAVIRUS 2 BY RT PCR (HOSPITAL ORDER, PERFORMED IN ~~LOC~~ HOSPITAL LAB): SARS Coronavirus 2: NEGATIVE

## 2019-02-09 LAB — BASIC METABOLIC PANEL
Anion gap: 9 (ref 5–15)
BUN: 20 mg/dL (ref 8–23)
CO2: 27 mmol/L (ref 22–32)
Calcium: 8.2 mg/dL — ABNORMAL LOW (ref 8.9–10.3)
Chloride: 100 mmol/L (ref 98–111)
Creatinine, Ser: 0.65 mg/dL (ref 0.44–1.00)
GFR calc Af Amer: >60 mL/min (ref >60)
GFR calc non Af Amer: >60 mL/min (ref >60)
Glucose, Bld: 126 mg/dL — ABNORMAL HIGH (ref 70–99)
Potassium: 3.1 mmol/L — ABNORMAL LOW (ref 3.5–5.1)
Sodium: 136 mmol/L (ref 135–145)

## 2019-02-09 LAB — CBC
HCT: 28.2 % — ABNORMAL LOW (ref 36.0–46.0)
Hemoglobin: 9.1 g/dL — ABNORMAL LOW (ref 12.0–15.0)
MCH: 32.7 pg (ref 26.0–34.0)
MCHC: 32.3 g/dL (ref 30.0–36.0)
MCV: 101.4 fL — ABNORMAL HIGH (ref 80.0–100.0)
Platelets: 336 10*3/uL (ref 150–400)
RBC: 2.78 MIL/uL — ABNORMAL LOW (ref 3.87–5.11)
RDW: 17.4 % — ABNORMAL HIGH (ref 11.5–15.5)
WBC: 10.4 10*3/uL (ref 4.0–10.5)
nRBC: 0 % (ref 0.0–0.2)

## 2019-02-09 LAB — URINALYSIS, COMPLETE (UACMP) WITH MICROSCOPIC
Bacteria, UA: NONE SEEN
Bilirubin Urine: NEGATIVE
Glucose, UA: NEGATIVE mg/dL
Hgb urine dipstick: NEGATIVE
Ketones, ur: 5 mg/dL — AB
Leukocytes,Ua: NEGATIVE
Nitrite: NEGATIVE
Protein, ur: NEGATIVE mg/dL
Specific Gravity, Urine: 1.018 (ref 1.005–1.030)
pH: 5 (ref 5.0–8.0)

## 2019-02-09 NOTE — TOC Initial Note (Signed)
Transition of Care [facility](TOC) - Initial/Assessment Note    Patient Details  Name: Krystal Herrera MRN: [id number] Date of Birth: May 05, 1941  Transition of Care [facility](TOC) CM/SW Contact:    Allayne ButcherJeanna M Lakendrick Paradis, RN Phone Number: 02/09/2019, 2:59 PM  Clinical Narrative:  Patient is being seen in the ED for weakness.  Patient reports that she has not been able to get up since Saturday and her weakness continues to get worse.  Patient was admitted last week for toe amputation.  Patient is followed by Dr. Wyn Quakerew.  Patient reports that she lives at home with her husband in [place].  Patient reports that her husband is not in the best health and requires a wheelchair.  Patient herself has a wheelchair and rolling walker.  Patient is open with [facility] and Feliberto GottronJason Hinton is aware that patient is in the ED.  Patient is requesting SNF, PT eval pending.  Patient reports that she is legally blind.  Pt is a smoker.  Patient does not have a PCP, Dr. Wyn Quakerew is her only provider, patient reports that she has not been to see another doctor or PCP for over 30 years.  Patient only has Medicare part A, she pays for prescriptions out of pocket.  RNCM will cont to follow.                   Expected Discharge Plan: Skilled Nursing Facility Barriers to Discharge: Other (comment), Continued Medical Work up(PT eval)   Patient Goals and CMS Choice Patient states their goals for this hospitalization and ongoing recovery are:: Patient thinks she needs SNF for rehab and that is where she wants to go       Expected Discharge Plan and Services Expected Discharge Plan: Skilled Nursing Facility   Discharge Planning Services: CM Consult   Living arrangements for the past 2 months: Single Family Home                                      Prior Living Arrangements/Services Living arrangements for the past 2 months: Single Family Home Lives with:: Spouse Patient language and need for interpreter reviewed:: No Do you feel  safe going back to the place where you live?: No   patient reports she cant take care of herself and needs rehab  Need for Family Participation in Patient Care: Yes (Comment)(pt needs help with self care) Care giver support system in place?: Yes (comment)(husband) Current home services: DME, Homehealth aide, Home RN, Home PT(wheelchair, rolling walker) Criminal Activity/Legal Involvement Pertinent to Current Situation/Hospitalization: No - Comment as needed  Activities of Daily Living      Permission Sought/Granted Permission sought to share information with : Case Manager, Magazine features editoracility Contact Representative Permission granted to share information with : Yes, Verbal Permission Granted     Permission granted to share info w AGENCY: [facility], SNF         Emotional Assessment Appearance:: Appears stated age Attitude/Demeanor/Rapport: Engaged Affect (typically observed): Accepting Orientation: : Oriented to Self, Oriented to Place, Oriented to Situation, Oriented to  Time Alcohol / Substance Use: Tobacco Use Psych Involvement: No (comment)  Admission diagnosis:  weakness Patient Active Problem List   Diagnosis Date Noted  . Gangrene of right foot (HCC) 02/02/2019  . Atrial fibrillation with RVR (HCC) 01/21/2019  . Tobacco use disorder 01/12/2019  . Elevated BP without diagnosis of hypertension 01/12/2019  .  Atherosclerosis of native arteries of the extremities with gangrene (HCC) 01/12/2019   PCP:  Patient, No Pcp Per Pharmacy:   [id number] - [place], [place] - [address] [place] [address] Phone: [phone] Fax: [phone]     Social Determinants of Health (SDOH) Interventions    Readmission Risk Interventions No flowsheet data found.

## 2019-02-09 NOTE — Progress Notes (Signed)
Patient presents to the hospital 1 week after right transmetatarsal amputation for gangrenous toes.  She is failing to thrive at home.  She has been unable to get up from her couch for the last 3 days.  Her husband has failing health as well and is no longer able to pick her up and move her around to see was in years past.  They are both well-known to me and our great people.  She has right-sided weakness likely as a result of previous strokes.  She has atrial fibrillation that was not diagnosed until recently but likely is longstanding.  I removed her dressing today and it was saturated but this was mostly clear fluid.  Her skin appears viable with the only questionable area on the medial plantar flap using cast.  The skin is well opposed.  She has moderate swelling in the right lower extremity which is similar to when her preoperative status.  No DVT seen on duplex.  Although her foot is doing okay, she is clearly failing at home and is going to need placement at least for the next several weeks until her strength is improved and she is able to physically take more care of herself.  I have discussed this with the ER physician and the social worker who are now seeking placement.  She can follow-up in the office in 2 weeks for wound check.

## 2019-02-09 NOTE — ED Notes (Signed)
Pt given phone and assisted in calling her husband.

## 2019-02-09 NOTE — TOC Progression Note (Signed)
Transition of Care [facility]) - Progression Note    Patient Details  Name: Latiqua Stanfield MRN: [id number] Date of Birth: 04/25/1941  Transition of Care [facility]) CM/SW Contact  Allayne Butcher, RN Phone Number: 02/09/2019, 5:21 PM  Clinical Narrative:    [facility] has offered the patient a bed and the patient accepts.  Tina with [facility] reports that they can admit her tomorrow.  Patient will remain in the ED overnight.     Expected Discharge Plan: Skilled Nursing Facility Barriers to Discharge: Other (comment), Continued Medical Work up(PT eval)  Expected Discharge Plan and Services Expected Discharge Plan: Skilled Nursing Facility   Discharge Planning Services: CM Consult   Living arrangements for the past 2 months: Single Family Home                                       Social Determinants of Health (SDOH) Interventions    Readmission Risk Interventions No flowsheet data found.

## 2019-02-09 NOTE — ED Triage Notes (Signed)
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 Very HOH and blind per ems. Has been unable to get up from couch since Saturday, bowel and urine present on couch, house unkept and dirty per EMS as well. Able to state first, last name and birthday.

## 2019-02-09 NOTE — ED Provider Notes (Signed)
[facility] Emergency Department Provider Note   ____________________________________________   First MD Initiated Contact with Patient 02/09/19 1146     (approximate)  I have reviewed the triage vital signs and the nursing notes.   HISTORY  Chief Complaint Foot Pain and Weakness    HPI Krystal Herrera is a 78 y.o. female here for evaluation of fatigue and weakness  Patient also reports no some increased swelling around her right lower leg and it feels warm.  She had a foot amputation a week ago.  She tells me that Dr. do specifically needs to be the one to remove her dressing and that she does not wish for anyone else to take it off.  Symptoms have slowly worsened over the last couple days the point she feels too tired and too fatigued to even walk on her own.  She cannot get up.  She had an appointment today at 3:00, but instead had to come to the ER because she is unable to ambulate and get to her appointment  She also has been having some incontinence.  She reports that she just feels very tired overall everywhere.  She does not have any weakness in one particular extremity  No cough.  No fevers or chills that she is aware of.  Just feels like everything is "backwards" like she is feels a little bit confused out of it thinks just not feeling right and the foot is troubling to her   Past Medical History:  Diagnosis Date  . Anxiety   . Arthritis   . Bronchitis    CHRONIC  . Complication of anesthesia 1985   bp, hr and breathing bottomed out during surgery-WITH THYROID SURGERY  . Dysrhythmia 01/21/2019  . Hypertension    NO MEDS  . Stroke (HCC)    HUSBAND ASSUMES POSSIBLE STROKE LAST MONTH BUT UNSURE-PT HAS NUMBNESS IN RIGHT ARM HAND     Patient Active Problem List   Diagnosis Date Noted  . Gangrene of right foot (HCC) 02/02/2019  . Atrial fibrillation with RVR (HCC) 01/21/2019  . Tobacco use disorder 01/12/2019  . Elevated BP without diagnosis  of hypertension 01/12/2019  . Atherosclerosis of native arteries of the extremities with gangrene (HCC) 01/12/2019    Past Surgical History:  Procedure Laterality Date  . LOWER EXTREMITY ANGIOGRAPHY Right 01/22/2019   Procedure: LOWER EXTREMITY ANGIOGRAPHY;  Surgeon: Annice Needy, MD;  Location: [facility] INVASIVE CV LAB;  Service: Cardiovascular;  Laterality: Right;  . THYROID SURGERY    . THYROIDECTOMY, PARTIAL    . 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 TUBAL LIGATION      Prior to Admission medications   Medication Sig Start Date End Date Taking? Authorizing Provider  apixaban (ELIQUIS) 5 MG TABS tablet Take 1 tablet (5 mg total) by mouth 2 (two) times daily. 01/23/19   Salary, Jetty Duhamel D, MD  aspirin EC 81 MG tablet Take 81 mg by mouth daily.    [provider]  atorvastatin (LIPITOR) 10 MG tablet Take 1 tablet (10 mg total) by mouth daily at 6 PM. 01/23/19   Salary, Evelena Asa, MD  diltiazem (CARDIZEM) 60 MG tablet Take 1 tablet (60 mg total) by mouth every 6 (six) hours. 01/23/19   Salary, Evelena Asa, MD  traMADol (ULTRAM) 50 MG tablet Take 1 tablet (50 mg total) by mouth every 8 (eight)  hours as needed for moderate pain or severe pain. 02/03/19   Stegmayer, Ranae PlumberKimberly A, PA-C    Allergies Hydrocodone-acetaminophen  Family History  Problem Relation Age of Onset  . Pneumonia Father   . Heart failure Father     Social History Social History   Tobacco Use  . Smoking status: Current Every Day Smoker    Packs/day: 1.00    Years: 40.00    Pack years: 40.00    Types: Cigarettes  . Smokeless tobacco: Never Used  Substance Use Topics  . Alcohol use: Yes    Alcohol/week: 1.0 standard drinks    Types: 1 Standard drinks or equivalent per week    Comment: daily  . Drug use: Never    Review of Systems Constitutional: No fever/chills just feels  very fatigued and weak Eyes: No visual changes. ENT: No sore throat. Cardiovascular: Denies chest pain. Respiratory: Denies shortness of breath. Gastrointestinal: No abdominal pain.   Genitourinary: Negative for dysuria. Musculoskeletal: See HPI Skin: Negative for rash. Neurological: Negative for headaches, areas of focal weakness or numbness.    ____________________________________________   PHYSICAL EXAM:  VITAL SIGNS: ED Triage Vitals  Enc Vitals Group     BP 02/09/19 1130 130/70     Pulse Rate 02/09/19 1109 70     Resp 02/09/19 1109 18     Temp 02/09/19 1109 98.1 F (36.7 C)     Temp Source 02/09/19 1109 Oral     SpO2 02/09/19 1109 92 %     Weight 02/09/19 1110 170 lb (77.1 kg)     Height 02/09/19 1110 5\' 3"  (1.6 m)     Head Circumference --      [facility] Flow --      Pain Score 02/09/19 1110 8     Pain Loc --      Pain Edu? --      Excl. in GC? --     Constitutional: Alert and oriented.  In no acute distress but appears chronically ill fatigued.  Eyes: Conjunctivae are normal.  She denies being in any severe pain reports achiness in the right foot right calf region present for several days and feels little swollen Head: Atraumatic. Nose: No congestion/rhinnorhea. Mouth/Throat: Mucous membranes are moist. Neck: No stridor.  Cardiovascular: Normal rate, regular rhythm. Grossly normal heart sounds.  Good peripheral circulation. Respiratory: Normal respiratory effort.  No retractions. Lungs CTAB. Gastrointestinal: Soft and nontender. No distention. Musculoskeletal: Bilateral lower extremity edema.  Some slight warmth in the right leg greater than the left lower.  Dressing over the right foot which the patient does not wish for me to take down or examine, she reports that she wants Dr. due to evaluated and no one other than Dr. do was to remove the dressing Neurologic:  Normal speech and language. No gross focal neurologic deficits are appreciated.  Skin:  Skin is warm,  dry and intact. No rash noted. Psychiatric: Mood and affect are normal. Speech and behavior are normal.  ____________________________________________   LABS (all labs ordered are listed, but only abnormal results are displayed)  Labs Reviewed  BASIC METABOLIC PANEL - Abnormal; Notable for the following components:      Result Value   Potassium 3.1 (*)    Glucose, Bld 126 (*)    Calcium 8.2 (*)    All other components within normal limits  CBC - Abnormal; Notable for the following components:   RBC 2.78 (*)    Hemoglobin 9.1 (*)    HCT  28.2 (*)    MCV 101.4 (*)    RDW 17.4 (*)    All other components within normal limits  URINALYSIS, COMPLETE (UACMP) WITH MICROSCOPIC - Abnormal; Notable for the following components:   Color, Urine YELLOW (*)    APPearance HAZY (*)    Ketones, ur 5 (*)    All other components within normal limits  SARS CORONAVIRUS 2 (HOSPITAL ORDER, PERFORMED IN [facility])   ____________________________________________  EKG   ____________________________________________  RADIOLOGY  US Venous Img Lower Unilateral Right  Result Date: 02/09/2019 CLINICAL DATA:  Right lower extremity pain and edema. History of right foot amputation 1 week ago. Evaluate for DVT. EXAM: RIGHT LOWER EXTREMITY VENOUS DOPPLER ULTRASOUND TECHNIQUE: Gray-scale sonography with graded compression, as well as color Doppler and duplex ultrasound were performed to evaluate the lower extremity deep venous systems from the level of the common femoral vein and including the common femoral, femoral, profunda femoral, popliteal and calf veins including the posterior tibial, peroneal and gastrocnemius veins when visible. The superficial great saphenous vein was also interrogated. Spectral Doppler was utilized to evaluate flow at rest and with distal augmentation maneuvers in the common femoral, femoral and popliteal veins. COMPARISON:  None. FINDINGS: Contralateral Common Femoral Vein:  Respiratory phasicity is normal and symmetric with the symptomatic side. No evidence of thrombus. Normal compressibility. Common Femoral Vein: No evidence of thrombus. Normal compressibility, respiratory phasicity and response to augmentation. Saphenofemoral Junction: No evidence of thrombus. Normal compressibility and flow on color Doppler imaging. Profunda Femoral Vein: No evidence of thrombus. Normal compressibility and flow on color Doppler imaging. Femoral Vein: There is a minimal amount of nonocclusive hypoechoic wall thickening involving the proximal (image 15) and distal (image 22) aspects of the right femoral vein. Popliteal Vein: No evidence of thrombus. Normal compressibility, respiratory phasicity and response to augmentation. Calf Veins: Appear patent where imaged. Superficial Great Saphenous Vein: No evidence of thrombus. Normal compressibility. Venous Reflux:  None. Other Findings: There is a moderate amount of subcutaneous edema within the leg. Note is made of a prominent though benign appearing right inguinal lymph node which is not enlarged by size criteria measuring 1 cm in greatest short axis diameter and maintains a benign fatty hilum, presumably reactive in etiology given history of recent right sided amputation. IMPRESSION: 1. No evidence of acute DVT within the right lower extremity. 2. Nonocclusive hypoechoic wall thickening/chronic involving the right femoral vein. Electronically Signed   By: Simonne Come M.D.   On: 02/09/2019 14:04    ____________________________________________   PROCEDURES  Procedure(s) performed: None  Procedures  Critical Care performed: No  ____________________________________________   INITIAL IMPRESSION / ASSESSMENT AND PLAN / ED COURSE  Pertinent labs & imaging results that were available during my care of the patient were reviewed by me and considered in my medical decision making (see chart for details).   Patient presents for increasing  fatigue several days after surgery.  She had a recent right lower extremity foot surgery.  She is not febrile, her vital signs are normal she does not appear toxic appears chronically ill generally fatigued without focal abnormality.  She does not complain of any cardiac pulmonary neurologic deficits.  Clinical examination of the right foot deferred to Dr. due to the patient refuses to allow me to examine it under the hand is yet.  Clinical Course as of Feb 09 1619  Tue Feb 09, 2019  1425 Still awaiting vascular surgery evaluation.    [MQ]  Clinical Course User Index [MQ] Sharyn Creamer, MD    ----------------------------------------- 3:43 PM on 02/09/2019 -----------------------------------------  Dr. Barbara Cower dew vascular surgery is seen and evaluated reports the foot appears to be healing well.  He does report that she appears quite fatigued and that obviously she cannot return to her home with her escalating care needs.  He recommends and agrees with current plan which is to consult with social work for possible placement.  Krystal Herrera was evaluated in Emergency Department on 02/09/2019 for the symptoms described in the history of present illness. She was evaluated in the context of the global COVID-19 pandemic, which necessitated consideration that the patient might be at risk for infection with the SARS-CoV-2 virus that causes COVID-19. Institutional protocols and algorithms that pertain to the evaluation of patients at risk for COVID-19 are in a state of rapid change based on information released by regulatory bodies including the CDC and federal and state organizations. These policies and algorithms were followed during the patient's care in the ED.  Patient was tested for COVID in anticipation that she may need to be admitted, however appears that she will first try to be placed into a elevated level of care setting. ____________________________________________   FINAL CLINICAL  IMPRESSION(S) / ED DIAGNOSES  Final diagnoses:  General weakness        Note:  This document was prepared using Dragon voice recognition software and may include unintentional dictation errors       Sharyn Creamer, MD 02/09/19 1621

## 2019-02-09 NOTE — ED Notes (Signed)
Pt given ginger ale.

## 2019-02-09 NOTE — ED Notes (Signed)
Pt given apple sauce

## 2019-02-09 NOTE — Progress Notes (Signed)
Had a good conversation with the husband on the phone just now.  He understands her debilitated state is really not safe for her to come home at this point.  He understands that she will need placement for rehab with hopeful improvement that could lead to discharge home in several weeks.  Discussed that her foot actually looked pretty good and was healing reasonably well at this time.  He is a very reasonable man and understands her status is very poor right now.  He understands that placement is in her best interest.

## 2019-02-09 NOTE — ED Notes (Signed)
Pt 88-90% on RA. Placed on 2L [place] 95% at this time.

## 2019-02-09 NOTE — ED Notes (Signed)
COVID sample walked to lab by this RN.  

## 2019-02-09 NOTE — ED Notes (Addendum)
Pt states she had partial right foot amputation X 1 week ago and went home on Saturday. Unable to go to rehab from hospital due to COVID pandemic. Pt has been sitting in recliner since Saturday, unable to move herself due to right sided weakness X 1 month per patient. Has followup appt with Dr. Wyn Quaker today regarding right foot amputation but states that she was unable to get up to get to appointment and called EMS due to the weakness. Pt not allowing this RN to undress right foot to assess surgical incision, dried drainage noted on dressing to right foot.

## 2019-02-09 NOTE — Telephone Encounter (Signed)
Patient's husband called stating that he cannot get the patient up off the couch. He stated the patient has only eaten twice since Sunday. Patient's husband also states that her right side is limp and she only sleeps. He was advised to call EMS and have her taken to the ED for evaluation. I spoke with Dr. Wyn Quaker and per Dr. Wyn Quaker that is what is needed at this time.

## 2019-02-09 NOTE — Evaluation (Signed)
Physical Therapy Evaluation Patient Details Name: Krystal Herrera MRN: [id number] DOB: [birth year] Today's Date: 02/09/2019   History of Present Illness  77yo female s/p R transmetatarsal amputation for gangrene on 02/02/19; discharged home 5/20 with return to ER 5/26 due to inability to manage care in home environment. PMHx includes anxiety, arthritis, chronic bronchitis, dysrhythmia, HTN, CVA.   Clinical Impression  Upon evaluation, patient alert and oriented; follows commands and demonstrates fair insight into deficits.  Known to therapist from previous admission.  Patient now generally weak and deconditioned; now requiring significantly more assist (Even for bed-level activities) than during previous admission.  Declined attempts at OOB mobility at this time due to pain and generalized weakness; also noted with pending vascular consult to address R transmet surgical site.  Will continue to assess/progress additional mobility needs in subsequent session as WBing restrictions clarified by surgical team. Would benefit from skilled PT to address above deficits and promote optimal return to PLOF; recommend transition to STR upon discharge from acute hospitalization.  Patient/husband clearly unable/unsafe to manage patient's care at home.  High risk for fall, injury and/or readmission given currently functional status.     Follow Up Recommendations SNF    Equipment Recommendations       Recommendations for Other Services       Precautions / Restrictions Precautions Precautions: Fall Other Brace: post-op shoe      Mobility  Bed Mobility               General bed mobility comments: patient declined due to pain/weakness  Transfers                 General transfer comment: patient declined due to pain/weakness  Ambulation/Gait             General Gait Details: patient declined due to pain/weakness  Stairs            Wheelchair Mobility    Modified Rankin  (Stroke Patients Only)       Balance                                             Pertinent Vitals/Pain Pain Assessment: Faces Pain Score: 8  Pain Location: R foot, L LE Pain Descriptors / Indicators: Aching;Grimacing;Guarding Pain Intervention(s): Limited activity within patient's tolerance;Monitored during session;Repositioned    Home Living Family/patient expects to be discharged to:: Private residence Living Arrangements: Spouse/significant other Available Help at Discharge: Family;Available 24 hours/day Type of Home: House Home Access: Stairs to enter Entrance Stairs-Rails: None Entrance Stairs-Number of Steps: 2 Home Layout: One level Home Equipment: Walker - 2 wheels;Cane - single point      Prior Function                 Hand Dominance        Extremity/Trunk Assessment   Upper Extremity Assessment Upper Extremity Assessment: Generalized weakness(grossly 4-/5 throughout; baseline tremor R UE)    Lower Extremity Assessment Lower Extremity Assessment: Generalized weakness(grossly 3-/5 throughout; baseline sensory deficit to R foot.  R foot transmet surgical site open to air; slightly reddened)       Communication      Cognition Arousal/Alertness: Awake/alert Behavior During Therapy: WFL for tasks assessed/performed Overall Cognitive Status: Within Functional Limits for tasks assessed  General Comments      Exercises Other Exercises Other Exercises: Supine LE therex, 1x10, act assist ROM: ankle pumps, heel slides, hip abduct/adduct.  Significant guarding due to pain/soreness/stiffness. Unable to mobilize throughout ful, functional range indep   Assessment/Plan    PT Assessment Patient needs continued PT services  PT Problem List Decreased strength;Decreased range of motion;Decreased balance;Decreased activity tolerance;Decreased safety awareness;Decreased mobility;Decreased  knowledge of precautions;Decreased skin integrity;Pain       PT Treatment Interventions DME instruction;Gait training;Stair training;Functional mobility training;Therapeutic activities;Therapeutic exercise;Balance training;Patient/family education    PT Goals (Current goals can be found in the Care Plan section)  Acute Rehab PT Goals Patient Stated Goal: to go to rehab PT Goal Formulation: With patient Time For Goal Achievement: 02/23/19 Potential to Achieve Goals: Fair    Frequency Min 2X/week   Barriers to discharge        Co-evaluation               AM-PAC PT "6 Clicks" Mobility  Outcome Measure Help needed turning from your back to your side while in a flat bed without using bedrails?: A Lot Help needed moving from lying on your back to sitting on the side of a flat bed without using bedrails?: A Lot Help needed moving to and from a bed to a chair (including a wheelchair)?: A Lot Help needed standing up from a chair using your arms (e.g., wheelchair or bedside chair)?: A Lot Help needed to walk in hospital room?: A Lot Help needed climbing 3-5 steps with a railing? : A Lot 6 Click Score: 12    End of Session Equipment Utilized During Treatment: Oxygen Activity Tolerance: Patient limited by pain;Patient limited by fatigue Patient left: in bed;with call bell/phone within reach Nurse Communication: Mobility status PT Visit Diagnosis: Muscle weakness (generalized) (M62.81);Difficulty in walking, not elsewhere classified (R26.2);Pain Pain - Right/Left: Right Pain - part of body: Ankle and joints of foot    Time: 1550-1609 PT Time Calculation (min) (ACUTE ONLY): 19 min   Charges:   PT Evaluation $PT Eval Moderate Complexity: 1 Mod PT Treatments $Therapeutic Exercise: 8-22 mins       Jeannia Tatro H. Manson PasseyBrown, PT, DPT, NCS 02/09/19, 5:00 PM [phone]

## 2019-02-09 NOTE — ED Notes (Signed)
Pt's right foot wrapped with gauze

## 2019-02-09 NOTE — ED Notes (Signed)
ED Provider at bedside. 

## 2019-02-09 NOTE — NC FL2 (Signed)
  [place] MEDICAID FL2 LEVEL OF CARE SCREENING TOOL     IDENTIFICATION  Patient Name: Krystal Herrera Birthdate: May 11, 1941 Sex: female Admission Date (Current Location): 02/09/2019  [place] and [place] Number:  Chiropodist and Address:  [facility], [address], [place], [place] [address]      Provider Number: [id number]  Attending Physician Name and Address:  Sharyn Creamer, MD  Relative Name and Phone Number:  Krystal Herrera, [phone]    Current Level of Care: Hospital Recommended Level of Care: Skilled Nursing Facility Prior Approval Number:    Date Approved/Denied:   PASRR Number: [id number]  Discharge Plan: SNF    Current Diagnoses: Patient Active Problem List   Diagnosis Date Noted  . Gangrene of right foot (HCC) 02/02/2019  . Atrial fibrillation with RVR (HCC) 01/21/2019  . Tobacco use disorder 01/12/2019  . Elevated BP without diagnosis of hypertension 01/12/2019  . Atherosclerosis of native arteries of the extremities with gangrene (HCC) 01/12/2019    Orientation RESPIRATION BLADDER Height & Weight     Self, Time, Situation, Place  Normal Continent Weight: 170 lb (77.1 kg) Height:  5\' 3"  (160 cm)  BEHAVIORAL SYMPTOMS/MOOD NEUROLOGICAL BOWEL NUTRITION STATUS      Continent Diet  AMBULATORY STATUS COMMUNICATION OF NEEDS Skin   Extensive Assist Verbally Surgical wounds(transmetatarsal amputation right foot)                       Personal Care Assistance Level of Assistance  Bathing, Feeding, Dressing Bathing Assistance: Maximum assistance Feeding assistance: Limited assistance Dressing Assistance: Maximum assistance     Functional Limitations Info  Sight Sight Info: Impaired(Legally Blind )        SPECIAL CARE FACTORS FREQUENCY  PT (By licensed PT), OT (By licensed OT)     PT Frequency: 5 times per week  OT Frequency: 5 times per week            Contractures Contractures Info: Not present     Additional Factors Info  Code Status, Allergies Code Status Info: FULL  Allergies Info: Hydrocodone-Acetaminophen            Current Medications (02/09/2019):  This is the current hospital active medication list No current facility-administered medications for this encounter.    Current Outpatient Medications  Medication Sig Dispense Refill  . apixaban (ELIQUIS) 5 MG TABS tablet Take 1 tablet (5 mg total) by mouth 2 (two) times daily. 60 tablet 0  . aspirin EC 81 MG tablet Take 81 mg by mouth daily.    Marland Kitchen atorvastatin (LIPITOR) 10 MG tablet Take 1 tablet (10 mg total) by mouth daily at 6 PM. 30 tablet 0  . diltiazem (CARDIZEM) 60 MG tablet Take 1 tablet (60 mg total) by mouth every 6 (six) hours. 120 tablet 0  . traMADol (ULTRAM) 50 MG tablet Take 1 tablet (50 mg total) by mouth every 8 (eight) hours as needed for moderate pain or severe pain. 21 tablet 0     Discharge Medications: Please see discharge summary for a list of discharge medications.  Relevant Imaging Results:  Relevant Lab Results:   Additional Information SSN:  [id number], COVID-19 test negative  Tania Aritza Brunet, LCSW

## 2019-02-09 NOTE — ED Notes (Signed)
Due to reported incontinence while sitting in chair since Saturday, unable to ambulate due to weakness, in and out cath performed for urine sample. Peri care provided for patient and banana collection device place on patient for incontinence. Pt verbalizes understanding, tolerated well. Awaiting EDP assessment.

## 2019-02-09 NOTE — ED Notes (Signed)
Case manager at bedside to assess for possible placement.

## 2019-02-10 ENCOUNTER — Encounter: Payer: Self-pay | Admitting: Emergency Medicine

## 2019-02-10 NOTE — ED Notes (Signed)
Pt sleeping at this time and in NAD 

## 2019-02-10 NOTE — ED Notes (Signed)
Pt is resting quietly. Pt is clean and dry.

## 2019-02-10 NOTE — ED Notes (Signed)
Pt placed in clean brief prior to discharge. Pt taken to [facility] via [facility]. Report called by Jearld Shines, RN.

## 2019-02-10 NOTE — Discharge Instructions (Addendum)
Vascular Surgery Discharge Instructions 1) Dry Kerlix to foot daily or sooner if increased drainage is noted.

## 2019-02-10 NOTE — ED Notes (Signed)
Patient is resting comfortably. 

## 2019-02-10 NOTE — ED Notes (Signed)
Patient resting comfortably eating a snack.

## 2019-02-10 NOTE — TOC Transition Note (Signed)
Transition of Care [facility]) - CM/SW Discharge Note   Patient Details  Name: Krystal Herrera MRN: [id number] Date of Birth: March 11, 1941  Transition of Care [facility]) CM/SW Contact:  Allayne Butcher, RN Phone Number: 02/10/2019, 9:08 AM   Clinical Narrative:    Patient ready for discharge today.  [facility] will be [facility] bed [facility].  RN to call report to 700 hall RN.  Husband of patient will be notified before she is transferred.    Final next level of care: Skilled Nursing Facility Barriers to Discharge: Barriers Resolved   Patient Goals and CMS Choice Patient states their goals for this hospitalization and ongoing recovery are:: Patient thinks she needs SNF for rehab and that is where she wants to go  CMS Medicare.gov Compare Post Acute Care list provided to:: Patient Choice offered to / list presented to : Patient  Discharge Placement              Patient chooses bed at: [facility] [place] Patient to be transferred to facility by: EMS Name of family member notified: Husband Patient and family notified of of transfer: 02/10/19  Discharge Plan and Services   Discharge Planning Services: CM Consult                                 Social Determinants of Health (SDOH) Interventions     Readmission Risk Interventions No flowsheet data found.

## 2019-02-10 NOTE — ED Notes (Signed)
Pt breakfast tray placed at bedside at this time. Pt continues to rest in bed with NAD noted, VSS. Will continue to monitor and give patient breakfast tray when she awakens.

## 2019-02-10 NOTE — ED Notes (Signed)
Meal and water was given at 12:40am.

## 2019-02-10 NOTE — ED Provider Notes (Signed)
Patient has been accepted [facility].  Remains hemodynamically stable and appropriate for discharge to the facility.   Willy Eddy, MD 02/10/19 [phone]

## 2019-02-10 NOTE — ED Notes (Signed)
Pt sitting in bed. Pt is confused as what time it is. Pt informed she will receive a rid to [facility] in the morning. Pt legs and body readjusted per request. Offered to get pt supplies. Pt declined needs or wants. Lights left on per request.

## 2019-02-11 ENCOUNTER — Telehealth (INDEPENDENT_AMBULATORY_CARE_PROVIDER_SITE_OTHER): Payer: Self-pay | Admitting: Nurse Practitioner

## 2019-02-11 NOTE — Telephone Encounter (Signed)
Jethro Bolus from [facility] (cell: [phone] or work [phone]) called stating that patient was sent to them from ED and needed some information from [place]. 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 If patient needs a post op shoe with transfers and the follow up apt.  Patient had  follow up apt with [place] on 03/05/19 but that was canceled.  Please advise. AS, CMA

## 2019-02-11 NOTE — Telephone Encounter (Signed)
Spoke with Krystal Herrera, advised her of the below and she verbalized understanding. AS, CMA

## 2019-02-11 NOTE — Telephone Encounter (Signed)
Please advise. AS, CMA 

## 2019-02-11 NOTE — Telephone Encounter (Signed)
Can you please contact [name] at [phone] to schedule apt for patient to be seen next wk with Dew per Vivia Birmingham. AS, CMA

## 2019-02-11 NOTE — Telephone Encounter (Signed)
Needs to know if patient can bear weight or her right foot. Please call and advise

## 2019-02-11 NOTE — Telephone Encounter (Signed)
Yes she can as long as she has her post op shoe

## 2019-02-11 NOTE — Telephone Encounter (Signed)
[name] with [facility] has called and left a message at 9am stating that they removed patient bandage this morning and the wound is oozing puss. Red, painful and hot to the touch. The Physician there would like to speak with Krystal Herrera about this. Please advise. AS, CMA   [name] (Nurse)- [phone]

## 2019-02-11 NOTE — Telephone Encounter (Signed)
Per Baird Lyons she was notified by Physician to call our office to notify. Not to consult with Dew. Per Vivia Birmingham give order for Keflex 500mg  BID x14days #28 with no refills. Verbal order given to [facility]. She verbalized understanding. Does patient need an apt scheduled to come into our office? Nursing facility asking. Please advise. AS, CMA

## 2019-02-11 NOTE — Telephone Encounter (Signed)
Yes she does, see if we can get her in to see me or dr. Wyn Quaker next week

## 2019-02-16 ENCOUNTER — Encounter (INDEPENDENT_AMBULATORY_CARE_PROVIDER_SITE_OTHER): Payer: Self-pay | Admitting: Vascular Surgery

## 2019-02-16 ENCOUNTER — Ambulatory Visit (INDEPENDENT_AMBULATORY_CARE_PROVIDER_SITE_OTHER): Payer: Self-pay | Admitting: Vascular Surgery

## 2019-02-16 ENCOUNTER — Other Ambulatory Visit: Payer: Self-pay

## 2019-02-16 VITALS — BP 120/78 | HR 93 | Resp 17

## 2019-02-16 DIAGNOSIS — I4891 Unspecified atrial fibrillation: Secondary | ICD-10-CM

## 2019-02-16 DIAGNOSIS — I70261 Atherosclerosis of native arteries of extremities with gangrene, right leg: Secondary | ICD-10-CM

## 2019-02-16 DIAGNOSIS — I96 Gangrene, not elsewhere classified: Secondary | ICD-10-CM

## 2019-02-16 NOTE — Progress Notes (Signed)
Patient ID: Krystal Herrera, female   DOB: 11-24-1940, 78 y.o.   MRN: [id number]  Chief Complaint  Patient presents with  . Follow-up    amputation site oozing    HPI Krystal Herrera is a 78 y.o. female.  Patient returns prior to her scheduled follow-up visit for drainage from her right foot wound.  She underwent a transmetatarsal amputation about a week and 1/2 to 2 weeks ago.  She came to the hospital last week profoundly weak and unable to get out of bed.  She has been admitted to [facility] and is getting some physical therapy now.  Her foot actually is not hurting as bad.  The wound however has separated from the midportion laterally and there is significant fibrinous exudate present.   Past Medical History:  Diagnosis Date  . Anxiety   . Arthritis   . Blind   . Bronchitis    CHRONIC  . Complication of anesthesia 1985   bp, hr and breathing bottomed out during surgery-WITH THYROID SURGERY  . Dysrhythmia 01/21/2019  . Femoral artery occlusion, right (HCC)   . Hypertension    NO MEDS  . Stroke (HCC)    HUSBAND ASSUMES POSSIBLE STROKE LAST MONTH BUT UNSURE-PT HAS NUMBNESS IN RIGHT ARM HAND     Past Surgical History:  Procedure Laterality Date  . LOWER EXTREMITY ANGIOGRAPHY Right 01/22/2019   Procedure: LOWER EXTREMITY ANGIOGRAPHY;  Surgeon: Annice Needyew,  S, MD;  Location: [facility] INVASIVE CV LAB;  Service: Cardiovascular;  Laterality: Right;  . THYROID SURGERY    . THYROIDECTOMY, PARTIAL    . 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 TUBAL LIGATION        Allergies  Allergen Reactions  . Hydrocodone-Acetaminophen Swelling    Itching to arms and swelling to face and lips, improved with benadryl. HUSBAND STATES ALL NARCOTICS AFFECT PT THIS WAY    Current Outpatient Medications  Medication Sig Dispense Refill  . Amino Acids-Protein Hydrolys  (FEEDING SUPPLEMENT, PRO-STAT SUGAR FREE 64,) LIQD Take 30 mLs by mouth daily.    Marland Kitchen. apixaban (ELIQUIS) 5 MG TABS tablet Take 1 tablet (5 mg total) by mouth 2 (two) times daily. 60 tablet 0  . aspirin EC 81 MG tablet Take 81 mg by mouth daily.    Marland Kitchen. atorvastatin (LIPITOR) 10 MG tablet Take 1 tablet (10 mg total) by mouth daily at 6 PM. 30 tablet 0  . diltiazem (CARDIZEM) 60 MG tablet Take 1 tablet (60 mg total) by mouth every 6 (six) hours. 120 tablet 0  . Multiple Vitamin (MULTIVITAMIN) capsule Take 1 capsule by mouth daily.    . traMADol (ULTRAM) 50 MG tablet Take 1 tablet (50 mg total) by mouth every 8 (eight) hours as needed for moderate pain or severe pain. 21 tablet 0  . vitamin C (ASCORBIC ACID) 250 MG tablet Take 250 mg by mouth 2 (two) times daily.     No current facility-administered medications for this visit.         Physical Exam BP 120/78 (BP Location: Right Arm)   Pulse 93   Resp 17  Gen:  WD/WN, NAD Skin: The wound has separated from the midportion laterally and there is significant fibrinous exudate present.  1-2+ right lower extremity edema which is actually a little better than it was before.  Mild surrounding erythema.  There is a foul odor.  Assessment/Plan:  Atrial fibrillation with RVR (HCC) Rate controlled now and on anticoagulation  Atherosclerosis of native arteries of the extremities with gangrene [facility]) S/p revascularization  Gangrene of right foot [facility]) The patient has had an already extended right transmetatarsal amputation.  The wound today is concerning.  I am going to give her some antibiotics and try some wet-to-dry dressings to see if we can get the wound cleaned up.  I am afraid that if we do any further surgical debridement we will be very unlikely to have enough foot to bear weight and heal.  We will try the antibiotics and local wound care over the next week or so and I will see her back to see how the foot looks.      Krystal Herrera  02/16/2019, 2:41 PM   This note was created with Dragon medical transcription system.  Any errors from dictation are unintentional.

## 2019-02-16 NOTE — Assessment & Plan Note (Signed)
Rate controlled now and on anticoagulation

## 2019-02-16 NOTE — Assessment & Plan Note (Signed)
The patient has had an already extended right transmetatarsal amputation.  The wound today is concerning.  I am going to give her some antibiotics and try some wet-to-dry dressings to see if we can get the wound cleaned up.  I am afraid that if we do any further surgical debridement we will be very unlikely to have enough foot to bear weight and heal.  We will try the antibiotics and local wound care over the next week or so and I will see her back to see how the foot looks.

## 2019-02-16 NOTE — Assessment & Plan Note (Signed)
S/p revascularization

## 2019-02-22 ENCOUNTER — Telehealth: Payer: Self-pay | Admitting: Cardiovascular Disease

## 2019-02-23 ENCOUNTER — Telehealth (INDEPENDENT_AMBULATORY_CARE_PROVIDER_SITE_OTHER): Payer: Self-pay | Admitting: Nurse Practitioner

## 2019-02-23 ENCOUNTER — Ambulatory Visit (INDEPENDENT_AMBULATORY_CARE_PROVIDER_SITE_OTHER): Payer: Medicare Other | Admitting: Nurse Practitioner

## 2019-02-23 NOTE — Telephone Encounter (Signed)
Takara with [facility] [phone]  Called requesting insurance information on patient from surgical pathology request sent from foot amputation.   Medicare- [id number]  Called Takara back and left message for her to return my call. AS, CMA

## 2019-02-23 NOTE — Telephone Encounter (Signed)
Spoke with Takara and gave her pt medicare number. AS, CMA

## 2019-02-25 ENCOUNTER — Other Ambulatory Visit: Payer: Self-pay

## 2019-02-25 ENCOUNTER — Encounter (INDEPENDENT_AMBULATORY_CARE_PROVIDER_SITE_OTHER): Payer: Self-pay | Admitting: Nurse Practitioner

## 2019-02-25 ENCOUNTER — Ambulatory Visit (INDEPENDENT_AMBULATORY_CARE_PROVIDER_SITE_OTHER): Payer: Medicare Other | Admitting: Nurse Practitioner

## 2019-02-25 ENCOUNTER — Ambulatory Visit: Payer: Self-pay | Admitting: Cardiovascular Disease

## 2019-02-25 VITALS — BP 159/87 | HR 89 | Resp 16

## 2019-02-25 DIAGNOSIS — I4891 Unspecified atrial fibrillation: Secondary | ICD-10-CM

## 2019-02-25 DIAGNOSIS — T8781 Dehiscence of amputation stump: Secondary | ICD-10-CM

## 2019-02-25 DIAGNOSIS — R030 Elevated blood-pressure reading, without diagnosis of hypertension: Secondary | ICD-10-CM

## 2019-02-25 DIAGNOSIS — Z7901 Long term (current) use of anticoagulants: Secondary | ICD-10-CM

## 2019-02-25 DIAGNOSIS — F17200 Nicotine dependence, unspecified, uncomplicated: Secondary | ICD-10-CM

## 2019-02-25 DIAGNOSIS — F17210 Nicotine dependence, cigarettes, uncomplicated: Secondary | ICD-10-CM

## 2019-02-25 DIAGNOSIS — Z79899 Other long term (current) drug therapy: Secondary | ICD-10-CM

## 2019-02-25 DIAGNOSIS — T8131XD Disruption of external operation (surgical) wound, not elsewhere classified, subsequent encounter: Secondary | ICD-10-CM

## 2019-02-25 MED ORDER — CEPHALEXIN 500 MG PO CAPS: 500.0000 mg | ORAL_CAPSULE | Freq: Two times a day (BID) | ORAL | 0 refills | Status: DC

## 2019-02-25 NOTE — Progress Notes (Signed)
SUBJECTIVE:  Patient ID: Krystal Herrera, female    DOB: [birth year], 78 y.o.   MRN: [id number] Chief Complaint  Patient presents with  . Wound Check    1week check    HPI  Krystal Herrera is a 78 y.o. female that presents today after recent right lower extremity transmetatarsal amputation.  On her previous visit there was some drainage, swelling and redness with some slight opening at the anastomosis site.  Today, the entire flap has dehisced.  There is significant fibrinous exudate within the wound bed which is very soggy.  Very little of the wound bed is visible.  The the proximal portion of the foot is erythematous in the foot/leg is swollen overall.  The patient continues to smoke on a daily basis.  The patient denies any great pain.  She states that the pain lessened once she received antibiotics at the last visit.  She denies any current fever, chills, nausea, vomiting or diarrhea.  She denies any chest pain or shortness of breath.  Past Medical History:  Diagnosis Date  . Anxiety   . Arthritis   . Blind   . Bronchitis    CHRONIC  . Complication of anesthesia 1985   bp, hr and breathing bottomed out during surgery-WITH THYROID SURGERY  . Dysrhythmia 01/21/2019  . Femoral artery occlusion, right (HCC)   . Hypertension    NO MEDS  . Stroke (HCC)    HUSBAND ASSUMES POSSIBLE STROKE LAST MONTH BUT UNSURE-PT HAS NUMBNESS IN RIGHT ARM HAND     Past Surgical History:  Procedure Laterality Date  . LOWER EXTREMITY ANGIOGRAPHY Right 01/22/2019   Procedure: LOWER EXTREMITY ANGIOGRAPHY;  Surgeon: Annice Needyew, Jason S, MD;  Location: [facility];  Service: Cardiovascular;  Laterality: Right;  . THYROID SURGERY    . THYROIDECTOMY, PARTIAL    . 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 TUBAL LIGATION      Social History   Socioeconomic History  . Marital  status: Married    Spouse name: Not on file  . Number of children: Not on file  . Years of education: Not on file  . Highest education level: Not on file  Occupational History  . Not on file  Social Needs  . Financial resource strain: Not on file  . Food insecurity    Worry: Not on file    Inability: Not on file  . Transportation needs    Medical: Not on file    Non-medical: Not on file  Tobacco Use  . Smoking status: Current Every Day Smoker    Packs/day: 1.00    Years: 40.00    Pack years: 40.00    Types: Cigarettes  . Smokeless tobacco: Never Used  Substance and Sexual Activity  . Alcohol use: Yes    Alcohol/week: 1.0 standard drinks    Types: 1 Standard drinks or equivalent per week    Comment: daily  . Drug use: Never  . Sexual activity: Not on file  Lifestyle  . Physical activity    Days per week: Not on file    Minutes per session: Not on file  . Stress: Not on file  Relationships  . Social Musicianconnections    Talks on phone: Not on file    Gets together: Not on file    Attends religious service: Not on file    Active member of  club or organization: Not on file    Attends meetings of clubs or organizations: Not on file    Relationship status: Not on file  . Intimate partner violence    Fear of current or ex partner: Not on file    Emotionally abused: Not on file    Physically abused: Not on file    Forced sexual activity: Not on file  Other Topics Concern  . Not on file  Social History Narrative  . Not on file    Family History  Problem Relation Age of Onset  . Pneumonia Father   . Heart failure Father     Allergies  Allergen Reactions  . Hydrocodone-Acetaminophen Swelling    Itching to arms and swelling to face and lips, improved with benadryl. HUSBAND STATES ALL NARCOTICS AFFECT PT THIS WAY     Review of Systems   Review of Systems: Negative Unless Checked Constitutional: [] Weight loss  [] Fever  [] Chills Cardiac: [] Chest pain   []  Atrial  Fibrillation  [] Palpitations   [] Shortness of breath when laying flat   [] Shortness of breath with exertion. [] Shortness of breath at rest Vascular:  [] Pain in legs with walking   [] Pain in legs with standing [] Pain in legs when laying flat   [] Claudication    [] Pain in feet when laying flat    [] History of DVT   [] Phlebitis   [x] Swelling in legs   [] Varicose veins   [] Non-healing ulcers Pulmonary:   [] Uses home oxygen   [] Productive cough   [] Hemoptysis   [] Wheeze  [] COPD   [] Asthma Neurologic:  [] Dizziness   [] Seizures  [] Blackouts [x] History of stroke   [] History of TIA  [] Aphasia   [] Temporary Blindness   [] Weakness or numbness in arm   [] Weakness or numbness in leg Musculoskeletal:   [] Joint swelling   [] Joint pain   [] Low back pain  []  History of Knee Replacement [] Arthritis [] back Surgeries  []  Spinal Stenosis    Hematologic:  [] Easy bruising  [] Easy bleeding   [] Hypercoagulable state   [] Anemic Gastrointestinal:  [] Diarrhea   [] Vomiting  [] Gastroesophageal reflux/heartburn   [] Difficulty swallowing. [] Abdominal pain Genitourinary:  [] Chronic kidney disease   [] Difficult urination  [] Anuric   [] Blood in urine [] Frequent urination  [] Burning with urination   [] Hematuria Skin:  [] Rashes   [] Ulcers [x] Wounds Psychological:  [] History of anxiety   []  History of major depression  []  Memory Difficulties      OBJECTIVE:   Physical Exam  BP (!) 159/87 (BP Location: Right Arm)   Pulse 89   Resp 16   Gen: WD/WN, NAD Head: [place]/AT, No temporalis wasting.  Ear/Nose/Throat: Hearing grossly intact, nares w/o erythema or drainage Eyes: PER, EOMI, sclera nonicteric.  Neck: Supple, no masses.  No JVD.  Pulmonary:  Good air movement, no use of accessory muscles.  Cardiac: RRR Vascular:  See photo of wound Vessel Right Left  Radial Palpable Palpable   Gastrointestinal: soft, non-distended. No guarding/no peritoneal signs.  Musculoskeletal: M/S 5/5 throughout.  No deformity or atrophy.  Neurologic:  Pain and light touch intact in extremities.  Symmetrical.  Speech is fluent. Motor exam as listed above. Psychiatric: Judgment intact, Mood & affect appropriate for pt's clinical situation. Dermatologic: No Venous rashes. No Ulcers Noted.  No changes consistent with cellulitis. Lymph : No Cervical lymphadenopathy, no lichenification or skin changes of chronic lymphedema.         ASSESSMENT AND PLAN:  1. Tobacco use disorder Smoking cessation was discussed, 3-10 minutes spent on  this topic specifically   2. Elevated BP without diagnosis of hypertension Continue antihypertensive medications as already ordered, these medications have been reviewed and there are no changes at this time.   3. Wound dehiscence, surgical, subsequent encounter Based upon the patient's wound, we will proceed with surgical debridement and wound VAC placement.  The risks, benefits and alternative therapies were reviewed in detail with the patient and husband.  All questions were answered.  The patient agrees to proceed with angio/intervention.  It is understood that this is done with the intention to salvage the limb however if this is unsuccessful in amputation may be the next step.    4. Atrial fibrillation with RVR (HCC) Continue antiarrhythmia medications as already ordered, these medications have been reviewed and there are no changes at this time.  Continue anticoagulation as ordered by Cardiology Service    Current Outpatient Medications on File Prior to Visit  Medication Sig Dispense Refill  . Amino Acids-Protein Hydrolys (FEEDING SUPPLEMENT, PRO-STAT SUGAR FREE 64,) LIQD Take 30 mLs by mouth daily.    Marland Kitchen. apixaban (ELIQUIS) 5 MG TABS tablet Take 1 tablet (5 mg total) by mouth 2 (two) times daily. 60 tablet 0  . aspirin EC 81 MG tablet Take 81 mg by mouth daily.    Marland Kitchen. atorvastatin (LIPITOR) 10 MG tablet Take 1 tablet (10 mg total) by mouth daily at 6 PM. 30 tablet 0  . diltiazem (CARDIZEM) 60 MG  tablet Take 1 tablet (60 mg total) by mouth every 6 (six) hours. 120 tablet 0  . Multiple Vitamin (MULTIVITAMIN) capsule Take 1 capsule by mouth daily.    . traMADol (ULTRAM) 50 MG tablet Take 1 tablet (50 mg total) by mouth every 8 (eight) hours as needed for moderate pain or severe pain. 21 tablet 0  . vitamin C (ASCORBIC ACID) 250 MG tablet Take 250 mg by mouth 2 (two) times daily.     No current facility-administered medications on file prior to visit.     There are no Patient Instructions on file for this visit. No follow-ups on file.   Georgiana SpinnerFallon E Fitzgerald Dunne, NP  This note was completed with Office managerDragon Dictation.  Any errors are purely unintentional.

## 2019-02-26 ENCOUNTER — Encounter (INDEPENDENT_AMBULATORY_CARE_PROVIDER_SITE_OTHER): Payer: Self-pay | Admitting: Nurse Practitioner

## 2019-03-01 ENCOUNTER — Other Ambulatory Visit (INDEPENDENT_AMBULATORY_CARE_PROVIDER_SITE_OTHER): Payer: Self-pay | Admitting: Nurse Practitioner

## 2019-03-01 ENCOUNTER — Encounter (INDEPENDENT_AMBULATORY_CARE_PROVIDER_SITE_OTHER): Payer: Self-pay

## 2019-03-02 ENCOUNTER — Encounter
Admission: RE | Admit: 2019-03-02 | Discharge: 2019-03-02 | Disposition: A | Discharge status: Home / Self Care | Payer: MEDICAID | Source: Ambulatory Visit | Attending: Vascular Surgery | Admitting: Vascular Surgery

## 2019-03-02 ENCOUNTER — Telehealth (INDEPENDENT_AMBULATORY_CARE_PROVIDER_SITE_OTHER): Payer: Self-pay

## 2019-03-02 ENCOUNTER — Other Ambulatory Visit (INDEPENDENT_AMBULATORY_CARE_PROVIDER_SITE_OTHER): Payer: Self-pay | Admitting: Nurse Practitioner

## 2019-03-02 MED ORDER — CHLORHEXIDINE GLUCONATE CLOTH 2 % EX PADS
6.0000 | MEDICATED_PAD | Freq: Once | CUTANEOUS | Status: DC
  Filled 2019-03-02: qty 6

## 2019-03-02 NOTE — Patient Instructions (Signed)
Your procedure is scheduled on: 03-04-19 THURSDAY Report to Same Day Surgery 2nd floor medical mall [facility](Medical Mall Entrance-take elevator on left to 2nd floor.  Check in with surgery information desk.) To find out your arrival time please call [phone] between 1PM - 3PM [facility]  Remember: Instructions that are not followed completely may result in serious medical risk, up to and including death, or upon the discretion of your surgeon and anesthesiologist your surgery may need to be rescheduled.    _x___ 1. Do not eat food after midnight the night before your procedure. NO GUM OR CANDY AFTER MIDNIGHT. You may drink clear liquids up to 2 hours before you are scheduled to arrive at the hospital for your procedure.  Do not drink clear liquids within 2 hours of your scheduled arrival to the hospital.  Clear liquids include  --Water or Apple juice without pulp  --Clear carbohydrate beverage such as ClearFast or Gatorade  --Black Coffee or Clear Tea (No milk, no creamers, do not add anything to the coffee or Tea   ____Ensure clear carbohydrate drink on the way to the hospital for bariatric patients  ____Ensure clear carbohydrate drink 3 hours before surgery for Dr Rutherford NailByrnett's patients if physician instructed.    __x__ 2. No Alcohol for 24 hours before or after surgery.   __x__3. No Smoking or e-cigarettes for 24 prior to surgery.  Do not use any chewable tobacco products for at least 6 hour prior to surgery   ____  4. Bring all medications with you on the day of surgery if instructed.    __x__ 5. Notify your doctor if there is any change in your medical condition     (cold, fever, infections).    x___6. On the morning of surgery brush your teeth with toothpaste and water.  You may rinse your mouth with mouth wash if you wish.  Do not swallow any toothpaste or mouthwash.   Do not wear jewelry, make-up, hairpins, clips or nail polish.  Do not wear lotions, powders, or perfumes.  You may wear deodorant.  Do not shave 48 hours prior to surgery. Men may shave face and neck.  Do not bring valuables to the hospital.    [facility] is not [facility] for any belongings or valuables.               Contacts, dentures or bridgework may not be worn into surgery.  Leave your suitcase in the car. After surgery it may be brought to your room.  For patients admitted to the hospital, discharge time is determined by your treatment team.  _  Patients discharged the day of surgery will not be allowed to drive home.  You will need someone to drive you home and stay with you the night of your procedure.    Please read over the following fact sheets that you were given:   [facility] Preparing for [facility]  _x___ TAKE THE FOLLOWING MEDICATION THE MORNING OF SURGERY WITH A SMALL SIP OF WATER. These include:  1. CARDIZEM (DILTIAZEM)  2.  3.  4.  5.  6.  ____Fleets enema or Magnesium Citrate as directed.   ____ Use CHG Soap or sage wipes as directed on instruction sheet   ____ Use inhalers on the day of surgery and bring to hospital day of surgery  ____ Stop Metformin and Janumet 2 days prior to surgery.    ____ Take 1/2 of usual insulin dose the night before surgery and  none on the morning surgery.   _x___ Follow recommendations from Cardiologist, Pulmonologist or PCP regarding stopping Aspirin, Coumadin, Plavix ,Eliquis, Effient, or Pradaxa, and Pletal-OK TO CONTINUE ASPIRIN AND ELIQUIS-DO NOT TAKE AM OF SURGERY  X____Stop Anti-inflammatories such as Advil, Aleve, Ibuprofen, Motrin, Naproxen, Naprosyn, Goodies powders or aspirin products NOW- OK to take Tylenol OR TRAMADOL   ____ Stop supplements until after surgery.   ____ Bring C-Pap to the hospital.

## 2019-03-02 NOTE — Telephone Encounter (Signed)
Spoke with the patient's husband regarding her wound vac and [facility] ordering it.

## 2019-03-03 NOTE — Pre-Procedure Instructions (Signed)
Krystal Herrera FROM [facility] AWARE THAT PT HAS TO BE AT [facility]  AT 0800 FOR COVID TESTING AND THEN WILL BRING PT TO MED [facility] FOR HER 1215 PROCEDURE

## 2019-03-04 ENCOUNTER — Other Ambulatory Visit: Payer: Self-pay

## 2019-03-04 ENCOUNTER — Other Ambulatory Visit
Admission: RE | Admit: 2019-03-04 | Discharge: 2019-03-04 | Disposition: A | Discharge status: Home / Self Care | Payer: Self-pay | Source: Ambulatory Visit | Attending: Vascular Surgery | Admitting: Vascular Surgery

## 2019-03-04 ENCOUNTER — Encounter: Payer: Self-pay | Admitting: *Deleted

## 2019-03-04 ENCOUNTER — Telehealth (INDEPENDENT_AMBULATORY_CARE_PROVIDER_SITE_OTHER): Payer: Self-pay | Admitting: Nurse Practitioner

## 2019-03-04 ENCOUNTER — Encounter
Admission: RE | Disposition: A | Discharge status: Home / Self Care | Payer: Self-pay | Source: Home / Self Care | Attending: Vascular Surgery

## 2019-03-04 ENCOUNTER — Ambulatory Visit: Payer: Self-pay | Admitting: Anesthesiology

## 2019-03-04 ENCOUNTER — Ambulatory Visit
Admission: RE | Admit: 2019-03-04 | Discharge: 2019-03-04 | Disposition: A | Discharge status: Home / Self Care | Payer: Self-pay | Attending: Vascular Surgery | Admitting: Vascular Surgery

## 2019-03-04 DIAGNOSIS — Y838 Other surgical procedures as the cause of abnormal reaction of the patient, or of later complication, without mention of misadventure at the time of the procedure: Secondary | ICD-10-CM | POA: Insufficient documentation

## 2019-03-04 DIAGNOSIS — Z7901 Long term (current) use of anticoagulants: Secondary | ICD-10-CM | POA: Insufficient documentation

## 2019-03-04 DIAGNOSIS — Z7982 Long term (current) use of aspirin: Secondary | ICD-10-CM | POA: Insufficient documentation

## 2019-03-04 DIAGNOSIS — Z79899 Other long term (current) drug therapy: Secondary | ICD-10-CM | POA: Insufficient documentation

## 2019-03-04 DIAGNOSIS — T8743 Infection of amputation stump, right lower extremity: Secondary | ICD-10-CM | POA: Insufficient documentation

## 2019-03-04 DIAGNOSIS — F17200 Nicotine dependence, unspecified, uncomplicated: Secondary | ICD-10-CM | POA: Insufficient documentation

## 2019-03-04 DIAGNOSIS — I4891 Unspecified atrial fibrillation: Secondary | ICD-10-CM | POA: Insufficient documentation

## 2019-03-04 DIAGNOSIS — F419 Anxiety disorder, unspecified: Secondary | ICD-10-CM | POA: Insufficient documentation

## 2019-03-04 DIAGNOSIS — M1990 Unspecified osteoarthritis, unspecified site: Secondary | ICD-10-CM | POA: Insufficient documentation

## 2019-03-04 DIAGNOSIS — I10 Essential (primary) hypertension: Secondary | ICD-10-CM | POA: Insufficient documentation

## 2019-03-04 DIAGNOSIS — I69359 Hemiplegia and hemiparesis following cerebral infarction affecting unspecified side: Secondary | ICD-10-CM | POA: Insufficient documentation

## 2019-03-04 DIAGNOSIS — Z1159 Encounter for screening for other viral diseases: Secondary | ICD-10-CM | POA: Insufficient documentation

## 2019-03-04 HISTORY — PX: WOUND DEBRIDEMENT: SHX247

## 2019-03-04 LAB — TYPE AND SCREEN
ABO/RH(D): A POS
Antibody Screen: NEGATIVE

## 2019-03-04 LAB — CBC WITH DIFFERENTIAL/PLATELET
Abs Immature Granulocytes: 0.05 10*3/uL (ref 0.00–0.07)
Basophils Absolute: 0.1 10*3/uL (ref 0.0–0.1)
Basophils Relative: 1 %
Eosinophils Absolute: 0.2 10*3/uL (ref 0.0–0.5)
Eosinophils Relative: 2 %
HCT: 32.3 % — ABNORMAL LOW (ref 36.0–46.0)
Hemoglobin: 10.2 g/dL — ABNORMAL LOW (ref 12.0–15.0)
Immature Granulocytes: 0 %
Lymphocytes Relative: 18 %
Lymphs Abs: 2.1 10*3/uL (ref 0.7–4.0)
MCH: 29.8 pg (ref 26.0–34.0)
MCHC: 31.6 g/dL (ref 30.0–36.0)
MCV: 94.4 fL (ref 80.0–100.0)
Monocytes Absolute: 1.5 10*3/uL — ABNORMAL HIGH (ref 0.1–1.0)
Monocytes Relative: 13 %
Neutro Abs: 7.3 10*3/uL (ref 1.7–7.7)
Neutrophils Relative %: 66 %
Platelets: 459 10*3/uL — ABNORMAL HIGH (ref 150–400)
RBC: 3.42 MIL/uL — ABNORMAL LOW (ref 3.87–5.11)
RDW: 17.7 % — ABNORMAL HIGH (ref 11.5–15.5)
WBC: 11.2 10*3/uL — ABNORMAL HIGH (ref 4.0–10.5)
nRBC: 0 % (ref 0.0–0.2)

## 2019-03-04 LAB — BASIC METABOLIC PANEL
Anion gap: 11 (ref 5–15)
BUN: 10 mg/dL (ref 8–23)
CO2: 27 mmol/L (ref 22–32)
Calcium: 8.7 mg/dL — ABNORMAL LOW (ref 8.9–10.3)
Chloride: 97 mmol/L — ABNORMAL LOW (ref 98–111)
Creatinine, Ser: 0.86 mg/dL (ref 0.44–1.00)
GFR calc Af Amer: >60 mL/min (ref >60)
GFR calc non Af Amer: >60 mL/min (ref >60)
Glucose, Bld: 106 mg/dL — ABNORMAL HIGH (ref 70–99)
Potassium: 4.2 mmol/L (ref 3.5–5.1)
Sodium: 135 mmol/L (ref 135–145)

## 2019-03-04 LAB — APTT: aPTT: 31 seconds (ref 24–36)

## 2019-03-04 LAB — SARS CORONAVIRUS 2 BY RT PCR (HOSPITAL ORDER, PERFORMED IN ~~LOC~~ HOSPITAL LAB): SARS Coronavirus 2: NEGATIVE

## 2019-03-04 LAB — PROTIME-INR
INR: 1.2 (ref 0.8–1.2)
Prothrombin Time: 15.3 seconds — ABNORMAL HIGH (ref 11.4–15.2)

## 2019-03-04 SURGERY — DEBRIDEMENT, WOUND
Anesthesia: General | Site: Foot | Laterality: Right

## 2019-03-04 MED ORDER — FAMOTIDINE 20 MG PO TABS: 20.0000 mg | ORAL_TABLET | Freq: Once | ORAL | Status: DC

## 2019-03-04 MED ORDER — TRAMADOL HCL 50 MG PO TABS
ORAL_TABLET | ORAL | Status: AC
  Administered 2019-03-04: 50 mg via ORAL
  Filled 2019-03-04: qty 1

## 2019-03-04 MED ORDER — MIDAZOLAM HCL 2 MG/2ML IJ SOLN
INTRAMUSCULAR | Status: AC
  Filled 2019-03-04: qty 2

## 2019-03-04 MED ORDER — PROPOFOL 10 MG/ML IV BOLUS
INTRAVENOUS | Status: DC | PRN
  Administered 2019-03-04: 50 mg via INTRAVENOUS
  Administered 2019-03-04: 30 mg via INTRAVENOUS
  Administered 2019-03-04: 20 mg via INTRAVENOUS
  Administered 2019-03-04: 50 mg via INTRAVENOUS

## 2019-03-04 MED ORDER — PROPOFOL 10 MG/ML IV BOLUS
INTRAVENOUS | Status: AC
  Filled 2019-03-04: qty 20

## 2019-03-04 MED ORDER — CHLORHEXIDINE GLUCONATE CLOTH 2 % EX PADS: 6.0000 | MEDICATED_PAD | Freq: Once | CUTANEOUS | Status: DC

## 2019-03-04 MED ORDER — LACTATED RINGERS IV SOLN
INTRAVENOUS | Status: DC
  Administered 2019-03-04: 12:00:00 via INTRAVENOUS

## 2019-03-04 MED ORDER — FENTANYL CITRATE (PF) 100 MCG/2ML IJ SOLN
INTRAMUSCULAR | Status: AC
  Administered 2019-03-04: 25 ug via INTRAVENOUS
  Filled 2019-03-04: qty 2

## 2019-03-04 MED ORDER — PROMETHAZINE HCL 25 MG/ML IJ SOLN: 6.2500 mg | INTRAMUSCULAR | Status: DC | PRN

## 2019-03-04 MED ORDER — MIDAZOLAM HCL 2 MG/2ML IJ SOLN
INTRAMUSCULAR | Status: DC | PRN
  Administered 2019-03-04: 1 mg via INTRAVENOUS

## 2019-03-04 MED ORDER — FENTANYL CITRATE (PF) 100 MCG/2ML IJ SOLN
INTRAMUSCULAR | Status: AC
  Filled 2019-03-04: qty 4

## 2019-03-04 MED ORDER — FENTANYL CITRATE (PF) 100 MCG/2ML IJ SOLN
INTRAMUSCULAR | Status: DC | PRN
  Administered 2019-03-04 (×2): 50 ug via INTRAVENOUS

## 2019-03-04 MED ORDER — FENTANYL CITRATE (PF) 100 MCG/2ML IJ SOLN
25.0000 ug | INTRAMUSCULAR | Status: DC | PRN
  Administered 2019-03-04 (×4): 25 ug via INTRAVENOUS

## 2019-03-04 MED ORDER — KETAMINE HCL 50 MG/ML IJ SOLN
INTRAMUSCULAR | Status: AC
  Filled 2019-03-04: qty 10

## 2019-03-04 MED ORDER — TRAMADOL HCL 50 MG PO TABS
50.0000 mg | ORAL_TABLET | Freq: Once | ORAL | Status: AC
  Administered 2019-03-04: 14:00:00 50 mg via ORAL

## 2019-03-04 MED ORDER — CEFAZOLIN SODIUM-DEXTROSE 2-4 GM/100ML-% IV SOLN
INTRAVENOUS | Status: AC
  Filled 2019-03-04: qty 100

## 2019-03-04 MED ORDER — CEFAZOLIN SODIUM-DEXTROSE 2-4 GM/100ML-% IV SOLN
2.0000 g | INTRAVENOUS | Status: AC
  Administered 2019-03-04: 13:00:00 2 g via INTRAVENOUS

## 2019-03-04 MED ORDER — MEPERIDINE HCL 50 MG/ML IJ SOLN: 6.2500 mg | INTRAMUSCULAR | Status: DC | PRN

## 2019-03-04 MED ORDER — FAMOTIDINE 20 MG PO TABS
ORAL_TABLET | ORAL | Status: AC
  Administered 2019-03-04: 20 mg
  Filled 2019-03-04: qty 1

## 2019-03-04 SURGICAL SUPPLY — 27 items
BNDG COHESIVE 4X5 TAN STRL (GAUZE/BANDAGES/DRESSINGS) ×3 IMPLANT
BNDG GAUZE 4.5X4.1 6PLY STRL (MISCELLANEOUS) ×6 IMPLANT
BNDG STRETCH 4X75 STRL LF (GAUZE/BANDAGES/DRESSINGS) ×3 IMPLANT
CANISTER SUCT 1200ML W/VALVE (MISCELLANEOUS) ×3 IMPLANT
CANISTER WOUND CARE 500ML ATS (WOUND CARE) ×3 IMPLANT
CLEANER CAUTERY TIP 5X5 PAD (MISCELLANEOUS) ×8 IMPLANT
COVER WAND RF STERILE (DRAPES) ×3 IMPLANT
DRAPE INCISE IOBAN 66X45 STRL (DRAPES) ×3 IMPLANT
DRAPE LAPAROTOMY 100X77 ABD (DRAPES) ×3 IMPLANT
DRSG VAC ATS LRG SENSATRAC (GAUZE/BANDAGES/DRESSINGS) ×3 IMPLANT
DRSG VAC ATS MED SENSATRAC (GAUZE/BANDAGES/DRESSINGS) ×3 IMPLANT
ELECT REM PT RETURN 9FT ADLT (ELECTROSURGICAL) ×3
ELECTRODE REM PT RTRN 9FT ADLT (ELECTROSURGICAL) ×2 IMPLANT
GAUZE SPONGE 4X4 12PLY STRL (GAUZE/BANDAGES/DRESSINGS) ×3 IMPLANT
GLOVE BIO SURGEON STRL SZ7 (GLOVE) ×3 IMPLANT
GOWN STRL REUS W/ TWL LRG LVL3 (GOWN DISPOSABLE) ×2 IMPLANT
GOWN STRL REUS W/ TWL XL LVL3 (GOWN DISPOSABLE) ×2 IMPLANT
GOWN STRL REUS W/TWL LRG LVL3 (GOWN DISPOSABLE) ×1
GOWN STRL REUS W/TWL XL LVL3 (GOWN DISPOSABLE) ×1
KIT TURNOVER KIT A (KITS) ×3 IMPLANT
NS IRRIG 1000ML POUR BTL (IV SOLUTION) ×3 IMPLANT
PACK BASIN MINOR ARMC (MISCELLANEOUS) ×3 IMPLANT
PAD ABD DERMACEA PRESS 5X9 (GAUZE/BANDAGES/DRESSINGS) ×3 IMPLANT
PAD CLEANER CAUTERY TIP 5X5 (MISCELLANEOUS) ×4
SOL PREP PVP 2OZ (MISCELLANEOUS) ×3
SOLUTION PREP PVP 2OZ (MISCELLANEOUS) ×2 IMPLANT
SPONGE LAP 18X18 RF (DISPOSABLE) ×3 IMPLANT

## 2019-03-04 NOTE — Transfer of Care (Signed)
Immediate Anesthesia Transfer of Care Note  Patient: Krystal Herrera  Procedure(s) Performed: DEBRIDEMENT WOUND (Right Foot)  Patient Location: PACU  Anesthesia Type:General  Level of Consciousness: awake and alert   Airway & Oxygen Therapy: Patient Spontanous Breathing and Patient connected to face mask oxygen  Post-op Assessment: Report given to RN and Post -op Vital signs reviewed and stable  Post vital signs: Reviewed and stable  Last Vitals:  Vitals Value Taken Time  BP 111/84 03/04/19 1325  Temp    Pulse 99 03/04/19 1329  Resp 16 03/04/19 1329  SpO2 99 % 03/04/19 1329  Vitals shown include unvalidated device data.  Last Pain:  Vitals:   03/04/19 1024  TempSrc: Temporal  PainSc: 0-No pain         Complications: No apparent anesthesia complications

## 2019-03-04 NOTE — Discharge Instructions (Signed)

## 2019-03-04 NOTE — H&P (Signed)
[facility] History & Physical Update  The patient was interviewed and re-examined.  The patient's previous History and Physical has been reviewed and is unchanged.  There is no change in the plan of care. We plan to proceed with the scheduled procedure.  Leotis Pain, MD  03/04/2019, 11:54 AM

## 2019-03-04 NOTE — Telephone Encounter (Signed)
Silas Sacramento with [facility] [phone] called left message regarding patient wound vac. She has been trying to get in touch with someone at [facility] to order wound vac?  Spoke with Silas Sacramento- she stated she did get in touch with someone at [employer]. She was told by [facility] that [facility] had placed order for wound vac and that it was taken care of.  Angelique Blonder said that because patient is in nursing home facility that they have to place the order because they will be paying for it and they use whoever they are contracted with. May not be [facility]. But that patient should be receiving wound vac per Vicky at [facility]. AS, CMA

## 2019-03-04 NOTE — Anesthesia Preprocedure Evaluation (Signed)
Anesthesia Evaluation  Patient identified by MRN, date of birth, ID band Patient awake    Reviewed: Allergy & Precautions, H&P , NPO status , reviewed documented beta blocker date and time   History of Anesthesia Complications (+) history of anesthetic complications  Airway Mallampati: II  TM Distance: >3 FB     Dental  (+) Edentulous Upper, Missing   Pulmonary Current Smoker,     + decreased breath sounds      Cardiovascular hypertension, + dysrhythmias Atrial Fibrillation  Rhythm:irregular     Neuro/Psych Anxiety Residual R body weakness  CVA, Residual Symptoms    GI/Hepatic   Endo/Other    Renal/GU      Musculoskeletal  (+) Arthritis ,   Abdominal   Peds  Hematology   Anesthesia Other Findings Past Medical History: No date: Anxiety No date: Arthritis 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 No date: Bronchitis     Comment:  CHRONIC 9528: Complication of anesthesia     Comment:  bp, hr and breathing bottomed out during surgery-WITH               THYROID SURGERY 01/21/2019: Dysrhythmia No date: Femoral artery occlusion, right ([place]) No date: Hypertension     Comment:  NO MEDS No date: Stroke ([place])     Comment:  HUSBAND ASSUMES POSSIBLE STROKE LAST MONTH BUT UNSURE-PT              HAS NUMBNESS IN RIGHT ARM HAND   Past Surgical History: 01/22/2019: LOWER EXTREMITY ANGIOGRAPHY; Right     Comment:  Procedure: LOWER EXTREMITY ANGIOGRAPHY;  Surgeon: Algernon Huxley, MD;  Location: [place] CV [facility];  Service:               Cardiovascular;  Laterality: Right; No date: THYROID SURGERY No date: THYROIDECTOMY, PARTIAL 02/02/2019: TRANSMETATARSAL AMPUTATION; Right     Comment:  Procedure: TRANSMETATARSAL AMPUTATION;  Surgeon: Algernon Huxley, MD;  Location: [facility] ORS;  Service: Vascular;                Laterality: Right; No date: TUBAL LIGATION  BMI    Body Mass Index: 28.34 kg/m       Reproductive/Obstetrics                             Anesthesia Physical Anesthesia Plan  ASA: IV  Anesthesia Plan: General   Post-op Pain Management:    Induction: Intravenous  PONV Risk Score and Plan: Treatment may vary due to age or medical condition and TIVA  Airway Management Planned: Nasal Cannula, Natural Airway and LMA  Additional Equipment:   Intra-op Plan:   Post-operative Plan:   Informed Consent: I have reviewed the patients History and Physical, chart, labs and discussed the procedure including the risks, benefits and alternatives for the proposed anesthesia with the patient or authorized representative who has indicated his/her understanding and acceptance.     Dental Advisory Given  Plan Discussed with: CRNA  Anesthesia Plan Comments:         Anesthesia Quick Evaluation

## 2019-03-04 NOTE — Op Note (Signed)
    OPERATIVE NOTE   PROCEDURE: 1. Irrigation and debridement of skin, soft tissue, and muscle for approximately 60 cm on the right transmetatarsal amputation site  PRE-OPERATIVE DIAGNOSIS: Nonviable tissue and infection of right transmetatarsal amputation site  POST-OPERATIVE DIAGNOSIS: Same as above  SURGEON: Leotis Pain, MD  ASSISTANT(S): Hezzie Bump, PA-C  ANESTHESIA: MAC  ESTIMATED BLOOD LOSS: 5 cc  FINDING(S): None  SPECIMEN(S):  none  INDICATIONS:   Krystal Herrera is a 78 y.o. female who presents with opening of her right transmetatarsal amputation wound with a large amount of fibrinous exudate and slough.  We discussed debriding this tissue to try to promote healthy granulation tissue for wound healing.  Her skin edges are clearly viable and healthy-appearing.  There is no obvious exposed bone.  An assistant was present during the procedure to help facilitate the exposure and expedite the procedure.  DESCRIPTION: After obtaining full informed written consent, the patient was brought back to the operating room and placed supine upon the operating table.  The patient received IV antibiotics prior to induction.  After obtaining adequate anesthesia, the patient was prepped and draped in the standard fashion. The assistant provided retraction and mobilization to help facilitate exposure and expedite the procedure throughout the entire procedure.  This included dressing placement, using retractors, and optimizing lighting. The wound was then opened and excisional debridement was performed to the skin, soft tissue, and muscle fascia to remove all clearly non-viable tissue.  The tissue was taken back to bleeding tissue that appeared viable.  The debridement was performed with Metzenbaum scissors and the backside of the Bovie pad and encompassed an area of approximately 60 cm2.  The wound was approximately 10 cm in its widest length and approximately 6-1/2 to 7 cm in its farthest with.   After all clearly non-viable tissue was removed, a wet-to-dry dressing was placed as the skilled nursing facility did not send the equipment over to do a negative pressure dressing as was desired.  We have discussed this with the skilled nursing facility, and they will place a negative pressure dressing later today. The patient was then awakened from anesthesia and taken to the recovery room in stable condition having tolerated the procedure well.  COMPLICATIONS: none  CONDITION: stable  Leotis Pain  03/04/2019, 1:11 PM   This note was created with Dragon Medical transcription system. Any errors in dictation are purely unintentional.

## 2019-03-04 NOTE — Anesthesia Post-op Follow-up Note (Signed)
Anesthesia QCDR form completed.        

## 2019-03-05 ENCOUNTER — Ambulatory Visit (INDEPENDENT_AMBULATORY_CARE_PROVIDER_SITE_OTHER): Payer: Self-pay | Admitting: Vascular Surgery

## 2019-03-05 ENCOUNTER — Encounter (INDEPENDENT_AMBULATORY_CARE_PROVIDER_SITE_OTHER): Payer: Self-pay

## 2019-03-05 ENCOUNTER — Telehealth (INDEPENDENT_AMBULATORY_CARE_PROVIDER_SITE_OTHER): Payer: Self-pay

## 2019-03-05 ENCOUNTER — Encounter: Payer: Self-pay | Admitting: Vascular Surgery

## 2019-03-05 NOTE — Telephone Encounter (Signed)
I spoke with Krystal Herrera from [facility] and she been made aware with Dr Lucky Cowboy medical recommendations

## 2019-03-05 NOTE — Progress Notes (Signed)
Patient is in a nursing facility. The number we have on file is a family member.

## 2019-03-05 NOTE — Progress Notes (Signed)
Patient is in a nursing facility. The number on file is her husband. He does not know how she is feeling since he cannot visit the facility.

## 2019-03-09 NOTE — Anesthesia Postprocedure Evaluation (Signed)
Anesthesia Post Note  Patient: Krystal Herrera  Procedure(s) Performed: DEBRIDEMENT WOUND (Right Foot)  Patient location during evaluation: PACU Anesthesia Type: General Level of consciousness: awake and alert Pain management: pain level controlled Vital Signs Assessment: post-procedure vital signs reviewed and stable Respiratory status: spontaneous breathing, nonlabored ventilation and respiratory function stable Cardiovascular status: blood pressure returned to baseline and stable Postop Assessment: no apparent nausea or vomiting Anesthetic complications: no     Last Vitals:  Vitals:   03/04/19 1438 03/04/19 1526  BP: (!) 102/57 109/68  Pulse: 77 (!) 56  Resp: 14 14  Temp: 37.1 C   SpO2: 96% 97%    Last Pain:  Vitals:   03/04/19 1438  TempSrc: Temporal  PainSc: 0-No pain                 Alphonsus Sias

## 2019-03-23 ENCOUNTER — Ambulatory Visit (INDEPENDENT_AMBULATORY_CARE_PROVIDER_SITE_OTHER): Payer: Self-pay | Admitting: Vascular Surgery

## 2019-03-23 ENCOUNTER — Other Ambulatory Visit: Payer: Self-pay

## 2019-03-23 ENCOUNTER — Encounter (INDEPENDENT_AMBULATORY_CARE_PROVIDER_SITE_OTHER): Payer: Self-pay | Admitting: Vascular Surgery

## 2019-03-23 VITALS — BP 143/81 | HR 76 | Resp 19 | Ht 63.0 in | Wt 153.0 lb

## 2019-03-23 DIAGNOSIS — I96 Gangrene, not elsewhere classified: Secondary | ICD-10-CM

## 2019-03-23 DIAGNOSIS — T8131XD Disruption of external operation (surgical) wound, not elsewhere classified, subsequent encounter: Secondary | ICD-10-CM

## 2019-03-23 DIAGNOSIS — I4891 Unspecified atrial fibrillation: Secondary | ICD-10-CM

## 2019-03-23 DIAGNOSIS — Z7901 Long term (current) use of anticoagulants: Secondary | ICD-10-CM

## 2019-03-23 NOTE — Assessment & Plan Note (Signed)
Status post TMA.  The wound is slowly healing.  She could not tolerate the VAC.  Continue wet-to-dry dressings

## 2019-03-23 NOTE — Assessment & Plan Note (Signed)
TMA wound is improving.  Continue wet-to-dry dressings and recheck in 1 month.  Her small right heel ulceration can be dressed similarly.  Avoidance of pressure would be the best option.  Rooke boots or heel protectors would be helpful.

## 2019-03-23 NOTE — Progress Notes (Signed)
Patient ID: Krystal Herrera, female   DOB: 09-May-1941, 78 y.o.   MRN: [id number]  Chief Complaint  Patient presents with  . Follow-up    new lesion on right heel    HPI Krystal Herrera is a 78 y.o. female.  Patient returns in follow-up.  Her right TMA is slowly healing and has a good base of granulation tissue.  She has a small dime sized heel ulceration on the right foot that is somewhat painful.  No surrounding erythema or drainage.  No fever or chills.   Past Medical History:  Diagnosis Date  . Anxiety   . Arthritis   . Blind   . Bronchitis    CHRONIC  . Complication of anesthesia 1985   bp, hr and breathing bottomed out during surgery-WITH THYROID SURGERY  . Dysrhythmia 01/21/2019  . Femoral artery occlusion, right ([place])   . Hypertension    NO MEDS  . Stroke ([place])    HUSBAND ASSUMES POSSIBLE STROKE LAST MONTH BUT UNSURE-PT HAS NUMBNESS IN RIGHT ARM HAND     Past Surgical History:  Procedure Laterality Date  . LOWER EXTREMITY ANGIOGRAPHY Right 01/22/2019   Procedure: LOWER EXTREMITY ANGIOGRAPHY;  Surgeon: Algernon Huxley, MD;  Location: [facility];  Service: Cardiovascular;  Laterality: Right;  . THYROID SURGERY    . THYROIDECTOMY, PARTIAL    . 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 TUBAL LIGATION    . WOUND DEBRIDEMENT Right 03/04/2019   Procedure: DEBRIDEMENT WOUND;  Surgeon: Algernon Huxley, MD;  Location: [facility];  Service: Vascular;  Laterality: Right;      Allergies  Allergen Reactions  . Hydrocodone-Acetaminophen Swelling    Itching to arms and swelling to face and lips, improved with benadryl. HUSBAND STATES ALL NARCOTICS AFFECT PT THIS WAY    Current Outpatient Medications  Medication Sig Dispense Refill  . Amino Acids-Protein Hydrolys (FEEDING SUPPLEMENT, PRO-STAT SUGAR FREE 64,) LIQD Take 30 mLs by mouth daily.    Marland Kitchen  apixaban (ELIQUIS) 5 MG TABS tablet Take 1 tablet (5 mg total) by mouth 2 (two) times daily. 60 tablet 0  . aspirin EC 81 MG tablet Take 81 mg by mouth daily.    Marland Kitchen atorvastatin (LIPITOR) 10 MG tablet Take 1 tablet (10 mg total) by mouth daily at 6 PM. 30 tablet 0  . cephALEXin (KEFLEX) 500 MG capsule Take 1 capsule (500 mg total) by mouth 2 (two) times daily. 28 capsule 0  . diltiazem (CARDIZEM) 60 MG tablet Take 1 tablet (60 mg total) by mouth every 6 (six) hours. (Patient taking differently: Take 60 mg by mouth 2 (two) times a day. 0800 and 2000) 120 tablet 0  . Multiple Vitamin (MULTIVITAMIN) capsule Take 1 capsule by mouth daily.    Marland Kitchen senna-docusate (SENOKOT-S) 8.6-50 MG tablet Take 1 tablet by mouth 2 (two) times daily.    . traMADol (ULTRAM) 50 MG tablet Take 1 tablet (50 mg total) by mouth every 8 (eight) hours as needed for moderate pain or severe pain. 21 tablet 0  . vitamin C (ASCORBIC ACID) 250 MG tablet Take 250 mg by mouth 2 (two) times daily.     No current facility-administered medications for this visit.         Physical Exam BP (!) 143/81 (BP Location: Left Arm)   Pulse 76   Resp 19  Ht 5\' 3"  (1.6 m)   Wt 153 lb (69.4 kg)   BMI 27.10 kg/m  Gen:  WD/WN, NAD Skin: right heel wound about the size of a dime which is clean and superficial.  The TMA wound is improving and granulating well.  It is still sizeable but decreasing in size.  It is clean appearing with no infection.      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  The wound is slowly healing.  She could not tolerate the VAC.  Continue wet-to-dry dressings  Atrial fibrillation with RVR (HCC) Rate controlled and on anticoagulation  Wound dehiscence, surgical, subsequent encounter TMA wound is improving.  Continue wet-to-dry dressings and recheck in 1 month.  Her small right heel ulceration can be dressed similarly.  Avoidance of pressure would be the best option.  [place] boots or heel  protectors would be helpful.      Festus BarrenJason Kalia Vahey 03/23/2019, 2:15 PM   This note was created with Dragon medical transcription system.  Any errors from dictation are unintentional.

## 2019-03-23 NOTE — Assessment & Plan Note (Signed)
Rate controlled and on anticoagulation. 

## 2019-03-25 ENCOUNTER — Telehealth (INDEPENDENT_AMBULATORY_CARE_PROVIDER_SITE_OTHER): Payer: Self-pay

## 2019-03-25 NOTE — Telephone Encounter (Addendum)
Patient husband left a voicemail requesting directions on how to bandage the patient wound.I did inform him to do wet to dry from the note and he ask what to use on her heel and inform him to contact home health to see when they will be coming out to the home for wound care.

## 2019-04-04 ENCOUNTER — Other Ambulatory Visit (INDEPENDENT_AMBULATORY_CARE_PROVIDER_SITE_OTHER): Payer: Self-pay | Admitting: Vascular Surgery

## 2019-04-05 ENCOUNTER — Telehealth (INDEPENDENT_AMBULATORY_CARE_PROVIDER_SITE_OTHER): Payer: Self-pay

## 2019-04-05 NOTE — Telephone Encounter (Signed)
I spoke with Philis Pique (P.A) and she ok for me call in medication refill

## 2019-04-05 NOTE — Telephone Encounter (Signed)
Patient husband left a message seeing if we can refill this medication 

## 2019-04-05 NOTE — Telephone Encounter (Signed)
Patient's husband called wanting pain medication for the patient. He states the patient came home form rehab with 33 tablets for pain and now she doesn't have any and is in extreme pain.

## 2019-04-05 NOTE — Telephone Encounter (Signed)
Patient husband left a message seeing if we can refill this medication

## 2019-04-08 ENCOUNTER — Telehealth (INDEPENDENT_AMBULATORY_CARE_PROVIDER_SITE_OTHER): Payer: Self-pay | Admitting: Vascular Surgery

## 2019-04-08 NOTE — Telephone Encounter (Signed)
Sherry with [facility] calling stating that she was unable to get intouch with her wound care nurse and is requesting to keep patient R foot care a dry wrap and to paint the heel with Betadine. Please advise. AS, CMA

## 2019-04-09 NOTE — Telephone Encounter (Signed)
Krystal Herrera is aware of the below. AS, CMA 

## 2019-04-14 DIAGNOSIS — E785 Hyperlipidemia, unspecified: Secondary | ICD-10-CM | POA: Insufficient documentation

## 2019-04-14 DIAGNOSIS — I739 Peripheral vascular disease, unspecified: Secondary | ICD-10-CM | POA: Insufficient documentation

## 2019-04-14 DIAGNOSIS — I10 Essential (primary) hypertension: Secondary | ICD-10-CM | POA: Insufficient documentation

## 2019-04-19 ENCOUNTER — Telehealth (INDEPENDENT_AMBULATORY_CARE_PROVIDER_SITE_OTHER): Payer: Self-pay

## 2019-04-19 NOTE — Telephone Encounter (Signed)
Home health nurse has been informed with the approval

## 2019-04-19 NOTE — Telephone Encounter (Signed)
That is fine 

## 2019-04-20 ENCOUNTER — Telehealth (INDEPENDENT_AMBULATORY_CARE_PROVIDER_SITE_OTHER): Payer: Self-pay

## 2019-04-20 NOTE — Telephone Encounter (Signed)
Patient has been inform with Dr Lucky Cowboy medical advice and her husband will be coming by the office today to pick the samples.

## 2019-04-20 NOTE — Telephone Encounter (Signed)
Merry Proud (PT) of [facility] had left a message stating the patient was listed weight bearing to right foot and wanted to know was there any restrictions to gait distance.Merry Proud informed that the patient is doing very well. I spoke with Dr Lucky Cowboy and recommended there is no restrictions with gait distance and Merry Proud has been made aware with information.

## 2019-04-27 ENCOUNTER — Ambulatory Visit (INDEPENDENT_AMBULATORY_CARE_PROVIDER_SITE_OTHER): Payer: Self-pay | Admitting: Vascular Surgery

## 2019-04-27 ENCOUNTER — Encounter (INDEPENDENT_AMBULATORY_CARE_PROVIDER_SITE_OTHER): Payer: Self-pay | Admitting: Vascular Surgery

## 2019-04-27 ENCOUNTER — Encounter (INDEPENDENT_AMBULATORY_CARE_PROVIDER_SITE_OTHER): Payer: Self-pay

## 2019-04-27 ENCOUNTER — Other Ambulatory Visit: Payer: Self-pay

## 2019-04-27 VITALS — BP 151/83 | HR 147 | Resp 12 | Ht 63.0 in | Wt 150.0 lb

## 2019-04-27 DIAGNOSIS — I96 Gangrene, not elsewhere classified: Secondary | ICD-10-CM

## 2019-04-27 DIAGNOSIS — I4891 Unspecified atrial fibrillation: Secondary | ICD-10-CM

## 2019-04-27 DIAGNOSIS — I70261 Atherosclerosis of native arteries of extremities with gangrene, right leg: Secondary | ICD-10-CM

## 2019-04-27 NOTE — Assessment & Plan Note (Signed)
Right TMA which is slowly healing

## 2019-04-27 NOTE — Assessment & Plan Note (Signed)
Right TMA wound is trying to heal but this has been a slow process.  Had revascularization.  Check her perfusion in about 1 month with wound check at that time

## 2019-04-27 NOTE — Assessment & Plan Note (Signed)
On anticoagulation and rate control 

## 2019-04-27 NOTE — Progress Notes (Signed)
Patient ID: Krystal Herrera, female   DOB: April 14, 1941, 78 y.o.   MRN: [id number]  Chief Complaint  Patient presents with  . Follow-up    HPI Krystal Herrera is a 78 y.o. female.  Patient returns in follow-up of her right foot wound.  She is almost 3 months status post right transmetatarsal amputation.  About a month later, she had a subsequent debridement.  Her wound has been slowly healing.  She did not tolerate a VAC but her wound has gradually gotten better.  She does have pain although it is much less than it used to be and gradually getting better.   Past Medical History:  Diagnosis Date  . Anxiety   . Arthritis   . Blind   . Bronchitis    CHRONIC  . Complication of anesthesia 1985   bp, hr and breathing bottomed out during surgery-WITH THYROID SURGERY  . Dysrhythmia 01/21/2019  . Femoral artery occlusion, right (HCC)   . Hypertension    NO MEDS  . Stroke (H[facility])    HUSBAND ASSUMES POSSIBLE STROKE LAST MONTH BUT UNSURE-PT HAS NUMBNESS IN RIGHT ARM HAND     Past Surgical History:  Procedure Laterality Date  . LOWER EXTREMITY ANGIOGRAPHY Right 01/22/2019   Procedure: LOWER EXTREMITY ANGIOGRAPHY;  Surgeon: Annice Needyew, Jason S, MD;  Location: [facility];  Service: Cardiovascular;  Laterality: Right;  . THYROID SURGERY    . THYROIDECTOMY, PARTIAL    . 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 TUBAL LIGATION    . WOUND DEBRIDEMENT Right 03/04/2019   Procedure: DEBRIDEMENT WOUND;  Surgeon: Annice Needyew, Jason S, MD;  Location: [facility];  Service: Vascular;  Laterality: Right;      Allergies  Allergen Reactions  . Hydrocodone-Acetaminophen Swelling    Itching to arms and swelling to face and lips, improved with benadryl. HUSBAND STATES ALL NARCOTICS AFFECT PT THIS WAY    Current Outpatient Medications  Medication Sig Dispense Refill  . Amino  Acids-Protein Hydrolys (FEEDING SUPPLEMENT, PRO-STAT SUGAR FREE 64,) LIQD Take 30 mLs by mouth daily.    Marland Kitchen. apixaban (ELIQUIS) 5 MG TABS tablet Take 1 tablet (5 mg total) by mouth 2 (two) times daily. 60 tablet 0  . aspirin EC 81 MG tablet Take 81 mg by mouth daily.    Marland Kitchen. atorvastatin (LIPITOR) 10 MG tablet Take 1 tablet (10 mg total) by mouth daily at 6 PM. 30 tablet 0  . cephALEXin (KEFLEX) 500 MG capsule Take 1 capsule (500 mg total) by mouth 2 (two) times daily. 28 capsule 0  . diltiazem (CARDIZEM) 60 MG tablet Take 1 tablet (60 mg total) by mouth every 6 (six) hours. (Patient taking differently: Take 60 mg by mouth 2 (two) times a day. 0800 and 2000) 120 tablet 0  . Multiple Vitamin (MULTIVITAMIN) capsule Take 1 capsule by mouth daily.    Marland Kitchen. senna-docusate (SENOKOT-S) 8.6-50 MG tablet Take 1 tablet by mouth 2 (two) times daily.    . traMADol (ULTRAM) 50 MG tablet Take 1 tablet (50 mg total) by mouth every 8 (eight) hours as needed for moderate pain or severe pain. 21 tablet 0  . vitamin C (ASCORBIC ACID) 250 MG tablet Take 250 mg by mouth 2 (two) times daily.     No current facility-administered medications for this visit.         Physical Exam BP Marland Kitchen(!)  151/83 (BP Location: Left Arm, Patient Position: Sitting, Cuff Size: Normal)   Pulse (!) 147   Resp 12   Ht 5\' 3"  (1.6 m)   Wt 150 lb (68 kg)   BMI 26.57 kg/m  Gen:  WD/WN, NAD Skin: Right TMA wound with good granulation tissue and decreased about a third of the size that it was previously.  No surrounding erythema or drainage.     Assessment/Plan:  Atrial fibrillation with RVR (HCC) On anticoagulation and rate control  Gangrene of right foot (HCC) Right TMA which is slowly healing  Atherosclerosis of native arteries of the extremities with gangrene (HCC) Right TMA wound is trying to heal but this has been a slow process.  Had revascularization.  Check her perfusion in about 1 month with wound check at that time       Leotis Pain 04/27/2019, 2:10 PM   This note was created with Dragon medical transcription system.  Any errors from dictation are unintentional.

## 2019-04-28 ENCOUNTER — Other Ambulatory Visit (INDEPENDENT_AMBULATORY_CARE_PROVIDER_SITE_OTHER): Payer: Self-pay

## 2019-05-06 DIAGNOSIS — H547 Unspecified visual loss: Secondary | ICD-10-CM | POA: Insufficient documentation

## 2019-05-12 ENCOUNTER — Telehealth (INDEPENDENT_AMBULATORY_CARE_PROVIDER_SITE_OTHER): Payer: Self-pay | Admitting: Nurse Practitioner

## 2019-05-12 NOTE — Telephone Encounter (Signed)
Trish with [facility] [phone]) called stating she saw patient on Monday and that patient had more slush in her wound bed this week than she did last week. No increase odor, no swelling. Patient is receiving daily dressings with Antseptic gel and normal saline wet to drys. Please advise if something needs to be done. AS, CMA

## 2019-05-12 NOTE — Telephone Encounter (Signed)
Spoke with Krystal Herrera and advised her of the below. She verbalized understanding and said she would call us back to notify of any changes to the wound and also if she needs rx of Santyl sent in to pharmacy. AS, CMA

## 2019-05-12 NOTE — Telephone Encounter (Signed)
Instead of antiseptic gel, lets use santyl with the wet to dry dressing to see if it debrides some of the wound.  She can keep same scheduled follow up

## 2019-05-31 ENCOUNTER — Telehealth (INDEPENDENT_AMBULATORY_CARE_PROVIDER_SITE_OTHER): Payer: Self-pay | Admitting: Nurse Practitioner

## 2019-05-31 NOTE — Telephone Encounter (Signed)
Larena Glassman with [facility] [phone] called and said patient has been complaining of increased sensitivity to the plantar aspect of her Right foot. Larena Glassman doesn't notice any new changes in her foot. She stated that patient Right leg is swollen but that's not new. Asking if we want to send in med for patient? Patient has upcoming apt with [place] on 06/02/19. AS, CMA

## 2019-06-01 NOTE — Telephone Encounter (Signed)
We will address when she comes in tomorrow after assessing her

## 2019-06-02 ENCOUNTER — Ambulatory Visit (INDEPENDENT_AMBULATORY_CARE_PROVIDER_SITE_OTHER): Payer: Self-pay | Admitting: Nurse Practitioner

## 2019-06-02 ENCOUNTER — Encounter (INDEPENDENT_AMBULATORY_CARE_PROVIDER_SITE_OTHER): Payer: Self-pay | Admitting: Nurse Practitioner

## 2019-06-02 ENCOUNTER — Ambulatory Visit (INDEPENDENT_AMBULATORY_CARE_PROVIDER_SITE_OTHER): Payer: Self-pay

## 2019-06-02 ENCOUNTER — Telehealth (INDEPENDENT_AMBULATORY_CARE_PROVIDER_SITE_OTHER): Payer: Self-pay

## 2019-06-02 ENCOUNTER — Other Ambulatory Visit: Payer: Self-pay

## 2019-06-02 ENCOUNTER — Encounter (INDEPENDENT_AMBULATORY_CARE_PROVIDER_SITE_OTHER): Payer: Self-pay

## 2019-06-02 VITALS — BP 116/70 | HR 76 | Resp 16

## 2019-06-02 DIAGNOSIS — E785 Hyperlipidemia, unspecified: Secondary | ICD-10-CM

## 2019-06-02 DIAGNOSIS — I70261 Atherosclerosis of native arteries of extremities with gangrene, right leg: Secondary | ICD-10-CM

## 2019-06-02 DIAGNOSIS — G546 Phantom limb syndrome with pain: Secondary | ICD-10-CM

## 2019-06-02 DIAGNOSIS — T8131XD Disruption of external operation (surgical) wound, not elsewhere classified, subsequent encounter: Secondary | ICD-10-CM

## 2019-06-02 MED ORDER — GABAPENTIN 300 MG PO CAPS: 300.0000 mg | ORAL_CAPSULE | Freq: Every day | ORAL | 3 refills | Status: DC

## 2019-06-02 NOTE — Telephone Encounter (Signed)
Per Eulogio Ditch NP I called in orders to continue physical therapy with [facility] and orders were given to [facility].

## 2019-06-04 ENCOUNTER — Telehealth (INDEPENDENT_AMBULATORY_CARE_PROVIDER_SITE_OTHER): Payer: Self-pay

## 2019-06-04 NOTE — Telephone Encounter (Signed)
[facility] nurse Venida Jarvis) called requesting orders to continue wound care 3 times a week for the right foot. I spoke with Eulogio Ditch NP and she approve the requesting wound order.

## 2019-06-09 ENCOUNTER — Encounter (INDEPENDENT_AMBULATORY_CARE_PROVIDER_SITE_OTHER): Payer: Self-pay | Admitting: Nurse Practitioner

## 2019-06-09 DIAGNOSIS — G546 Phantom limb syndrome with pain: Secondary | ICD-10-CM | POA: Insufficient documentation

## 2019-06-09 NOTE — Progress Notes (Signed)
SUBJECTIVE:  Patient ID: Krystal Herrera, female    DOB: 13-Jul-1941, 78 y.o.   MRN: [id number] Chief Complaint  Patient presents with  . Follow-up    ultrasound follow up    HPI  Krystal Herrera is a 78 y.o. 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  Her wound on her right lower extremity looks much better than the last time I saw her in office.  The patient has been doing daily dressing changes done by home health as well as her husband.  No signs and symptoms of infection or cellulitis.  The patient does endorse having some phantom limb pains that are worse at night.  She also endorses being sensitive to the touch especially during dressing changes.  She does not describe it as pain per se but just extreme sensitivity.  She denies any fever, chills, nausea, vomiting or diarrhea.  She denies any chest pain or breath.  Today her ABIs are 1.07 on the right  it was previously 0.52 01/12/2019.  The left lower extremity has ABI 0.95.  Previously it was 0.90.  The patient has biphasic waveforms within the bilateral tibial arteries.  The left toe waveforms are strong.  Past Medical History:  Diagnosis Date  . Anxiety   . Arthritis   . Blind   . Bronchitis    CHRONIC  . Complication of anesthesia 1985   bp, hr and breathing bottomed out during surgery-WITH THYROID SURGERY  . Dysrhythmia 01/21/2019  . Femoral artery occlusion, right (HCC)   . Hypertension    NO MEDS  . Stroke ([facility])    HUSBAND ASSUMES POSSIBLE STROKE LAST MONTH BUT UNSURE-PT HAS NUMBNESS IN RIGHT ARM HAND     Past Surgical History:  Procedure Laterality Date  . LOWER EXTREMITY ANGIOGRAPHY Right 01/22/2019   Procedure: LOWER EXTREMITY ANGIOGRAPHY;  Surgeon: Annice Needy, MD;  Location: [facility];  Service: Cardiovascular;  Laterality: Right;  . THYROID SURGERY    . THYROIDECTOMY, PARTIAL    . 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 TUBAL LIGATION    . WOUND DEBRIDEMENT Right 03/04/2019   Procedure: DEBRIDEMENT WOUND;  Surgeon: Annice Needy, MD;  Location: [facility];  Service: Vascular;  Laterality: Right;    Social History   Socioeconomic History  . Marital status: Married    Spouse name: Not on file  . Number of children: Not on file  . Years of education: Not on file  . Highest education level: Not on file  Occupational History  . Not on file  Social Needs  . Financial resource strain: Not on file  . Food insecurity    Worry: Not on file    Inability: Not on file  . Transportation needs    Medical: Not on file    Non-medical: Not on file  Tobacco Use  . Smoking status: Current Every Day Smoker    Packs/day: 1.00    Years: 40.00    Pack years: 40.00    Types: Cigarettes  . Smokeless tobacco: Never Used  Substance and Sexual Activity  . Alcohol use: Yes    Alcohol/week: 1.0 standard drinks    Types: 1 Standard drinks or equivalent per week    Comment: daily  . Drug use: Never  . Sexual activity: Not on file  Lifestyle  . Physical activity    Days per week: Not on file    Minutes per session: Not on file  . Stress: Not on file  Relationships  . Social Musician on phone: Not on file    Gets together: Not on file    Attends religious service: Not on file    Active member of club or organization: Not on file    Attends meetings of clubs or organizations: Not on file    Relationship status: Not on file  . Intimate partner violence    Fear of current or ex partner: Not on file    Emotionally abused: Not on file    Physically abused: Not on file    Forced sexual activity: Not on file  Other Topics Concern  . Not on file  Social History Narrative  . Not on file    Family History  Problem Relation Age of Onset  . Pneumonia Father   . Heart failure  Father     Allergies  Allergen Reactions  . Hydrocodone-Acetaminophen Swelling    Itching to arms and swelling to face and lips, improved with benadryl. HUSBAND STATES ALL NARCOTICS AFFECT PT THIS WAY     Review of Systems   Review of Systems: Negative Unless Checked Constitutional: [] Weight loss  [] Fever  [] Chills Cardiac: [] Chest pain   []  Atrial Fibrillation  [] Palpitations   [] Shortness of breath when laying flat   [] Shortness of breath with exertion. [] Shortness of breath at rest Vascular:  [] Pain in legs with walking   [] Pain in legs with standing [x] Pain in legs when laying flat   [] Claudication    [] Pain in feet when laying flat    [] History of DVT   [] Phlebitis   [] Swelling in legs   [] Varicose veins   [] Non-healing ulcers Pulmonary:   [] Uses home oxygen   [] Productive cough   [] Hemoptysis   [] Wheeze  [] COPD   [] Asthma Neurologic:  [] Dizziness   [] Seizures  [] Blackouts [] History of stroke   [] History of TIA  [] Aphasia   [] Temporary Blindness   [] Weakness or numbness in arm   [x] Weakness or numbness in leg Musculoskeletal:   [] Joint swelling   [] Joint pain   [] Low back pain  []  History of Knee Replacement [x] Arthritis [] back Surgeries  []  Spinal Stenosis    Hematologic:  [] Easy bruising  [] Easy bleeding   [] Hypercoagulable state   [] Anemic Gastrointestinal:  [] Diarrhea   [] Vomiting  [] Gastroesophageal reflux/heartburn   [] Difficulty swallowing. [] Abdominal pain Genitourinary:  [] Chronic kidney disease   [] Difficult urination  [] Anuric   [] Blood in urine [] Frequent urination  [] Burning with urination   [] Hematuria Skin:  [] Rashes   [] Ulcers [] Wounds Psychological:  [x] History of anxiety   []  History of major depression  []  Memory Difficulties      OBJECTIVE:   Physical Exam  BP 116/70 (BP Location: Left Arm)   Pulse 76   Resp 16   Gen: WD/WN, NAD Head: [place]/AT, No temporalis wasting.  Ear/Nose/Throat: Hearing grossly intact, nares w/o erythema or drainage Eyes: PER, EOMI,  sclera nonicteric.  Neck: Supple, no masses.  No JVD.  Pulmonary:  Good air movement, no use of accessory muscles.  Cardiac: RRR Vascular: Previously dehisced transmetatarsal amputation.  Approximately 3x 6 cm area with granulation in wound bed.  Vessel Right Left  Radial Palpable Palpable   Gastrointestinal: soft, non-distended. No guarding/no peritoneal signs.  Musculoskeletal:   No deformity or atrophy.  Neurologic:   Symmetrical.  Speech is fluent. Motor exam as listed above. Psychiatric: Judgment intact, Mood & affect appropriate for pt's clinical situation. Dermatologic: No Venous rashes. No Ulcers Noted.  No changes consistent with cellulitis. Lymph : No Cervical lymphadenopathy, no lichenification or skin changes of chronic lymphedema.       ASSESSMENT AND PLAN:  1. Wound dehiscence, surgical, subsequent encounter Continue with daily wound dressings using santyl and wet to dry dressings. Patient will return in 3 months for wound evaluation   2. Hyperlipidemia, unspecified hyperlipidemia type Continue statin as ordered and reviewed, no changes at this time   3. Phantom limb pain ([place]) We will start the patient on a low dose of gabapentin per the husband's request.  Advised that it may take weeks to take effect and that titration could also be needed.   - gabapentin (NEURONTIN) 300 MG capsule; Take 1 capsule (300 mg total) by mouth at bedtime.  Dispense: 30 capsule; Refill: 3   Current Outpatient Medications on File Prior to Visit  Medication Sig Dispense Refill  . Amino Acids-Protein Hydrolys (FEEDING SUPPLEMENT, PRO-STAT SUGAR FREE 64,) LIQD Take 30 mLs by mouth daily.    Marland Kitchen apixaban (ELIQUIS) 5 MG TABS tablet Take 1 tablet (5 mg total) by mouth 2 (two) times daily. 60 tablet 0  . aspirin EC 81 MG tablet Take 81 mg by mouth daily.    Marland Kitchen atorvastatin (LIPITOR) 10 MG tablet Take 1 tablet (10 mg total) by mouth daily at 6 PM. 30 tablet 0  . diltiazem (CARDIZEM) 60 MG tablet  Take 1 tablet (60 mg total) by mouth every 6 (six) hours. (Patient taking differently: Take 30 mg by mouth 2 (two) times daily. 0800 and 2000) 120 tablet 0  . Multiple Vitamin (MULTIVITAMIN) capsule Take 1 capsule by mouth daily.    Marland Kitchen senna-docusate (SENOKOT-S) 8.6-50 MG tablet Take 1 tablet by mouth 2 (two) times daily.    . traMADol (ULTRAM) 50 MG tablet Take 1 tablet (50 mg total) by mouth every 8 (eight) hours as needed for moderate pain or severe pain. 21 tablet 0  . vitamin C (ASCORBIC ACID) 250 MG tablet Take 250 mg by mouth 2 (two) times daily.    . Zinc 50 MG TABS Take by mouth.     No current facility-administered medications on file prior to visit.     There are no Patient Instructions on file for this visit. No follow-ups on file.   Kris Hartmann, NP  This note was completed with Sales executive.  Any errors are purely unintentional.

## 2019-06-18 ENCOUNTER — Telehealth (INDEPENDENT_AMBULATORY_CARE_PROVIDER_SITE_OTHER): Payer: Self-pay

## 2019-06-18 NOTE — Telephone Encounter (Signed)
Home health nurse called stating they have being treating right foot wound with santyl ointment dressing changes daily. There is more slough to wound and the nurse has used antisepetic also on the wound. The nurse is requesting verbal orders to use hydrofera blue twice a week to help remove slough. I spoke with Eulogio Ditch NP and stated that it is fine to start new wound order. The nurse has been informed with information.

## 2019-07-05 ENCOUNTER — Telehealth (INDEPENDENT_AMBULATORY_CARE_PROVIDER_SITE_OTHER): Payer: Self-pay

## 2019-07-05 NOTE — Telephone Encounter (Signed)
I spoke with [place] home health nurse and informed her that Dr Lucky Cowboy stated that the patient can increase [place] to bid.

## 2019-07-12 ENCOUNTER — Telehealth (INDEPENDENT_AMBULATORY_CARE_PROVIDER_SITE_OTHER): Payer: Self-pay

## 2019-07-12 NOTE — Telephone Encounter (Signed)
The biggest problem with compression is that she has a large open wound on her right foot.  Therefore conventional compression is likely not going to work well.

## 2019-07-13 ENCOUNTER — Other Ambulatory Visit (INDEPENDENT_AMBULATORY_CARE_PROVIDER_SITE_OTHER): Payer: Self-pay | Admitting: Nurse Practitioner

## 2019-07-13 DIAGNOSIS — G546 Phantom limb syndrome with pain: Secondary | ICD-10-CM

## 2019-07-13 MED ORDER — GABAPENTIN 300 MG PO CAPS: 300.0000 mg | ORAL_CAPSULE | Freq: Two times a day (BID) | ORAL | 3 refills | Status: DC

## 2019-07-13 NOTE — Telephone Encounter (Signed)
The nurse will be wrapping both legs from the ankle to knee with ace wraps to help with swelling

## 2019-07-13 NOTE — Telephone Encounter (Signed)
The biggest problem with compression is the large wound on her foot.  We can try wrapping the patient over her dressings but that may require increased nursing visits to change the wraps. I will send in update Rx for gabapentin

## 2019-07-23 ENCOUNTER — Telehealth (INDEPENDENT_AMBULATORY_CARE_PROVIDER_SITE_OTHER): Payer: Self-pay

## 2019-09-07 ENCOUNTER — Encounter (INDEPENDENT_AMBULATORY_CARE_PROVIDER_SITE_OTHER): Payer: Self-pay | Admitting: Vascular Surgery

## 2019-09-07 ENCOUNTER — Ambulatory Visit (INDEPENDENT_AMBULATORY_CARE_PROVIDER_SITE_OTHER): Payer: Self-pay | Admitting: Vascular Surgery

## 2019-09-07 ENCOUNTER — Other Ambulatory Visit: Payer: Self-pay

## 2019-09-07 VITALS — BP 154/86 | HR 92 | Resp 18 | Ht 63.0 in | Wt 140.0 lb

## 2019-09-07 DIAGNOSIS — M7989 Other specified soft tissue disorders: Secondary | ICD-10-CM | POA: Insufficient documentation

## 2019-09-07 DIAGNOSIS — I4891 Unspecified atrial fibrillation: Secondary | ICD-10-CM

## 2019-09-07 DIAGNOSIS — I10 Essential (primary) hypertension: Secondary | ICD-10-CM

## 2019-09-07 DIAGNOSIS — I70261 Atherosclerosis of native arteries of extremities with gangrene, right leg: Secondary | ICD-10-CM

## 2019-09-07 NOTE — Assessment & Plan Note (Signed)
Rate controlled 

## 2019-09-07 NOTE — Progress Notes (Signed)
MRN : [id number]  Krystal Herrera is a 78 y.o. (1941-08-17) female who presents with chief complaint of  Chief Complaint  Patient presents with  . Follow-up    3 mo / no studies  .  History of Present Illness: Patient returns today in follow up of her right foot wound and leg pain and swelling.  Her swelling is fairly significant and seems to be impeding wound healing.  Her pain seems to be from pressure from the swelling as well.  She also has significant sensitivity to touch of the right leg and foot.  No signs of infection.  No erythema.  Minimal drainage.  The wound is slowly improving.  Current Outpatient Medications  Medication Sig Dispense Refill  . aspirin EC 81 MG tablet Take 81 mg by mouth daily.    Marland Kitchen. atorvastatin (LIPITOR) 10 MG tablet Take 1 tablet (10 mg total) by mouth daily at 6 PM. 30 tablet 0  . diltiazem (CARDIZEM) 60 MG tablet Take 1 tablet (60 mg total) by mouth every 6 (six) hours. (Patient taking differently: Take 30 mg by mouth 2 (two) times daily. 0800 and 2000) 120 tablet 0  . gabapentin (NEURONTIN) 300 MG capsule Take 1 capsule (300 mg total) by mouth 2 (two) times daily. 60 capsule 3  . Multiple Vitamin (MULTIVITAMIN) capsule Take 1 capsule by mouth daily.    Marland Kitchen. senna-docusate (SENOKOT-S) 8.6-50 MG tablet Take 1 tablet by mouth 2 (two) times daily.    . traMADol (ULTRAM) 50 MG tablet Take 1 tablet (50 mg total) by mouth every 8 (eight) hours as needed for moderate pain or severe pain. 21 tablet 0  . vitamin C (ASCORBIC ACID) 250 MG tablet Take 250 mg by mouth 2 (two) times daily.    . Zinc 50 MG TABS Take by mouth.    . Amino Acids-Protein Hydrolys (FEEDING SUPPLEMENT, PRO-STAT SUGAR FREE 64,) LIQD Take 30 mLs by mouth daily.    Marland Kitchen. apixaban (ELIQUIS) 5 MG TABS tablet Take 1 tablet (5 mg total) by mouth 2 (two) times daily. (Patient not taking: Reported on 09/07/2019) 60 tablet 0   No current facility-administered medications for this visit.    Past Medical  History:  Diagnosis Date  . Anxiety   . Arthritis   . Blind   . Bronchitis    CHRONIC  . Complication of anesthesia 1985   bp, hr and breathing bottomed out during surgery-WITH THYROID SURGERY  . Dysrhythmia 01/21/2019  . Femoral artery occlusion, right (HCC)   . Hypertension    NO MEDS  . Stroke (HCC)    HUSBAND ASSUMES POSSIBLE STROKE LAST MONTH BUT UNSURE-PT HAS NUMBNESS IN RIGHT ARM HAND     Past Surgical History:  Procedure Laterality Date  . LOWER EXTREMITY ANGIOGRAPHY Right 01/22/2019   Procedure: LOWER EXTREMITY ANGIOGRAPHY;  Surgeon: Annice Needyew, Capitola Ladson S, MD;  Location: [facility];  Service: Cardiovascular;  Laterality: Right;  . THYROID SURGERY    . THYROIDECTOMY, PARTIAL    . TRANSMETATARSAL AMPUTATION Right 02/02/2019   Procedure: TRANSMETATARSAL AMPUTATION;  Surgeon: Annice Needyew, Jenica Costilow S, MD;  Location: [facility];  Service: Vascular;  Laterality: Right;  . TUBAL LIGATION    . WOUND DEBRIDEMENT Right 03/04/2019   Procedure: DEBRIDEMENT WOUND;  Surgeon: Annice Needyew, Roen Macgowan S, MD;  Location: [facility];  Service: Vascular;  Laterality: Right;    Social History Social History   Tobacco Use  . Smoking status: Current Every Day Smoker    Packs/day: 1.00  Years: 40.00    Pack years: 40.00    Types: Cigarettes  . Smokeless tobacco: Never Used  Substance Use Topics  . Alcohol use: Yes    Alcohol/week: 1.0 standard drinks    Types: 1 Standard drinks or equivalent per week    Comment: daily  . Drug use: Never    Family History  Problem Relation Age of Onset  . Pneumonia Father   . Heart failure Father      Allergies  Allergen Reactions  . Hydrocodone-Acetaminophen Swelling    Itching to arms and swelling to face and lips, improved with benadryl. HUSBAND STATES ALL NARCOTICS AFFECT PT THIS WAY      REVIEW OF SYSTEMS (Negative unless checked)  Constitutional: [] ?Weight loss  [] ?Fever  [] ?Chills Cardiac: [] ?Chest pain   [] ?Chest pressure   [] ?Palpitations    [] ?Shortness of breath when laying flat   [] ?Shortness of breath at rest   [] ?Shortness of breath with exertion. Vascular:  [x] ?Pain in legs with walking   [] ?Pain in legs at rest   [] ?Pain in legs when laying flat   [] ?Claudication   [x] ?Pain in feet when walking  [x] ?Pain in feet at rest  [] ?Pain in feet when laying flat   [] ?History of DVT   [] ?Phlebitis   [] ?Swelling in legs   [] ?Varicose veins   [x] ?Non-healing ulcers Pulmonary:   [] ?Uses home oxygen   [] ?Productive cough   [] ?Hemoptysis   [] ?Wheeze  [] ?COPD   [] ?Asthma Neurologic:  [] ?Dizziness  [] ?Blackouts   [] ?Seizures   [x] ?History of stroke   [] ?History of TIA  [] ?Aphasia   [] ?Temporary blindness   [] ?Dysphagia   [] ?Weakness or numbness in arms   [] ?Weakness or numbness in legs Musculoskeletal:  [] ?Arthritis   [] ?Joint swelling   [] ?Joint pain   [] ?Low back pain Hematologic:  [] ?Easy bruising  [] ?Easy bleeding   [] ?Hypercoagulable state   [] ?Anemic  [] ?Hepatitis  Gastrointestinal:  [] ?Blood in stool   [] ?Vomiting blood  [] ?Gastroesophageal reflux/heartburn   [] ?Difficulty swallowing   Genitourinary:  [] ?Chronic kidney disease   [] ?Difficult urination  [] ?Frequent urination  [] ?Burning with urination   [] ?Blood in urine Skin:  [] ?Rashes   [x] ?Ulcers   [x] ?Wounds Psychological:  [] ?History of anxiety   [] ? History of major depression.  Physical Examination  BP (!) 154/86 (BP Location: Left Arm)   Pulse 92   Resp 18   Ht 5\' 3"  (1.6 m)   Wt 140 lb (63.5 kg)   BMI 24.80 kg/m  Gen:  WD/WN, NAD Head: [place]/AT, No temporalis wasting. Ear/Nose/Throat: Hearing grossly intact, nares w/o erythema or drainage Eyes: Conjunctiva clear. Sclera non-icteric.  Visual acuity very poor Neck: Supple.  Trachea midline Pulmonary:  Good air movement, no use of accessory muscles.  Cardiac: irregular Vascular:  Vessel Right Left  Radial Palpable Palpable                          PT Not Palpable Not Palpable  DP Not Palpable Not Palpable       Musculoskeletal: M/S 5/5 throughout.  No deformity or atrophy.  2-3+ right lower extremity edema, 1-2+ left lower extremity edema. Neurologic: Sensation grossly intact in extremities.  Symmetrical.  Speech is fluent.  Psychiatric: Judgment intact, Mood & affect appropriate for pt's clinical situation. Dermatologic: The right transmetatarsal amputation wound has a good granulation base although there is some slough present.  It is decreasing in size slowly but due to the leg swelling that is somewhat difficult.  Labs No results found for this or any previous visit (from the past 2160 hour(s)).  Radiology No results found.  Assessment/Plan Elevated BP without diagnosis of hypertension Her blood pressure is significantly elevated today.  There may be a component of pain causing some of the rise in blood pressure.  I suspect she has some underlying hypertension which is undiagnosed as she has not seen a doctor in many years  No problem-specific Assessment & Plan notes found for this encounter.    Leotis Pain, MD  09/07/2019 3:35 PM    This note was created with Dragon medical transcription system.  Any errors from dictation are purely unintentional

## 2019-09-07 NOTE — Assessment & Plan Note (Signed)
At this point, the things seem to be causing the most pain and difficulty with healing is her leg swelling.  We are going to try to wrap her in a 3 layer Unna boot today in the right leg.  That was done and hopefully she will be able to tolerate this.  She has very sensitive skin and has not been able to tolerate compression, Ace bandages, or a lymphedema pump up to this point.  She is trying to elevate her legs.  We will do weekly Unna boots and reassess in a few weeks.

## 2019-09-07 NOTE — Assessment & Plan Note (Signed)
Her right transmetatarsal amputation wound is trying to heal but her leg swelling seems to be impeding this.  Her perfusion was good at last check.  We can check this again in the next several months.

## 2019-09-08 ENCOUNTER — Telehealth (INDEPENDENT_AMBULATORY_CARE_PROVIDER_SITE_OTHER): Payer: Self-pay

## 2019-09-20 ENCOUNTER — Other Ambulatory Visit (INDEPENDENT_AMBULATORY_CARE_PROVIDER_SITE_OTHER): Payer: Self-pay | Admitting: Nurse Practitioner

## 2019-09-20 ENCOUNTER — Telehealth (INDEPENDENT_AMBULATORY_CARE_PROVIDER_SITE_OTHER): Payer: Self-pay

## 2019-09-20 DIAGNOSIS — G546 Phantom limb syndrome with pain: Secondary | ICD-10-CM

## 2019-09-20 MED ORDER — GABAPENTIN 300 MG PO CAPS: 300.0000 mg | ORAL_CAPSULE | Freq: Three times a day (TID) | ORAL | 3 refills | Status: DC

## 2019-09-20 NOTE — Telephone Encounter (Signed)
Home health nurse called stating the patient has increase her gabapentin to three times daily which is helping with pain relief and is requesting a new RX to be sent to pharmacy. Also the patient has stop taking Eliquis and the nurse needed to document on the patient chart that she has stop or was taking off. I informed the nurse that the patient reported on 09/07/19 during her visit that she was not taking Eliquis. Bethann Berkshire will put in the patient chart she is not taking the medication and I informed her that RX for Gabapentin has been sent into pharmacy by Caron Presume. NP.

## 2019-10-05 ENCOUNTER — Ambulatory Visit (INDEPENDENT_AMBULATORY_CARE_PROVIDER_SITE_OTHER): Payer: Medicare Other | Admitting: Vascular Surgery

## 2019-10-07 ENCOUNTER — Telehealth (INDEPENDENT_AMBULATORY_CARE_PROVIDER_SITE_OTHER): Payer: Self-pay

## 2019-10-07 NOTE — Telephone Encounter (Signed)
Home health nurse was made aware with medical advice and will make the patient aware

## 2019-10-12 ENCOUNTER — Encounter (INDEPENDENT_AMBULATORY_CARE_PROVIDER_SITE_OTHER): Payer: Self-pay | Admitting: Vascular Surgery

## 2019-10-12 ENCOUNTER — Other Ambulatory Visit: Payer: Self-pay

## 2019-10-12 ENCOUNTER — Encounter (INDEPENDENT_AMBULATORY_CARE_PROVIDER_SITE_OTHER): Payer: Self-pay

## 2019-10-12 ENCOUNTER — Ambulatory Visit (INDEPENDENT_AMBULATORY_CARE_PROVIDER_SITE_OTHER): Payer: Self-pay | Admitting: Vascular Surgery

## 2019-10-12 VITALS — BP 169/95 | HR 86 | Resp 16

## 2019-10-12 DIAGNOSIS — I70261 Atherosclerosis of native arteries of extremities with gangrene, right leg: Secondary | ICD-10-CM

## 2019-10-12 DIAGNOSIS — I4891 Unspecified atrial fibrillation: Secondary | ICD-10-CM

## 2019-10-12 DIAGNOSIS — R030 Elevated blood-pressure reading, without diagnosis of hypertension: Secondary | ICD-10-CM

## 2019-10-12 NOTE — Assessment & Plan Note (Signed)
Wound is slowly healing.  Status post revascularization and doing well.  Plan to recheck in 3 months with noninvasive studies

## 2019-10-12 NOTE — Progress Notes (Signed)
MRN : [id number]  Krystal Herrera is a 79 y.o. (1941/02/16) female who presents with chief complaint of  Chief Complaint  Patient presents with  . Follow-up    3-4 week follow up  .  History of Present Illness: Patient returns today in follow up of her right foot wound and lower extremity swelling.  Her swelling in the legs is actually worse than when I last saw her.  Her right foot wound despite the swelling is getting better.  Her husband is doing an outstanding job of local wound care.  This continues to decrease in size and is clean and healthy appearing.  She continues her anticoagulation.  She is now looking for a new primary care physician after difficulties with her last primary care doctor.  Current Outpatient Medications  Medication Sig Dispense Refill  . Amino Acids-Protein Hydrolys (FEEDING SUPPLEMENT, PRO-STAT SUGAR FREE 64,) LIQD Take 30 mLs by mouth daily.    Marland Kitchen apixaban (ELIQUIS) 5 MG TABS tablet Take 1 tablet (5 mg total) by mouth 2 (two) times daily. 60 tablet 0  . aspirin EC 81 MG tablet Take 81 mg by mouth daily.    Marland Kitchen atorvastatin (LIPITOR) 10 MG tablet Take 1 tablet (10 mg total) by mouth daily at 6 PM. 30 tablet 0  . diltiazem (CARDIZEM) 60 MG tablet Take 1 tablet (60 mg total) by mouth every 6 (six) hours. (Patient taking differently: Take 30 mg by mouth 2 (two) times daily. 0800 and 2000) 120 tablet 0  . gabapentin (NEURONTIN) 300 MG capsule Take 1 capsule (300 mg total) by mouth 3 (three) times daily. 90 capsule 3  . Multiple Vitamin (MULTIVITAMIN) capsule Take 1 capsule by mouth daily.    Marland Kitchen senna-docusate (SENOKOT-S) 8.6-50 MG tablet Take 1 tablet by mouth 2 (two) times daily.    . traMADol (ULTRAM) 50 MG tablet Take 1 tablet (50 mg total) by mouth every 8 (eight) hours as needed for moderate pain or severe pain. 21 tablet 0  . vitamin C (ASCORBIC ACID) 250 MG tablet Take 250 mg by mouth 2 (two) times daily.    . Zinc 50 MG TABS Take by mouth.     No current  facility-administered medications for this visit.    Past Medical History:  Diagnosis Date  . Anxiety   . Arthritis   . Blind   . Bronchitis    CHRONIC  . Complication of anesthesia 1985   bp, hr and breathing bottomed out during surgery-WITH THYROID SURGERY  . Dysrhythmia 01/21/2019  . Femoral artery occlusion, right ([place])   . Hypertension    NO MEDS  . Stroke ([place])    HUSBAND ASSUMES POSSIBLE STROKE LAST MONTH BUT UNSURE-PT HAS NUMBNESS IN RIGHT ARM HAND     Past Surgical History:  Procedure Laterality Date  . LOWER EXTREMITY ANGIOGRAPHY Right 01/22/2019   Procedure: LOWER EXTREMITY ANGIOGRAPHY;  Surgeon: Algernon Huxley, MD;  Location: [place] [facility];  Service: Cardiovascular;  Laterality: Right;  . THYROID SURGERY    . THYROIDECTOMY, PARTIAL    . 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 TUBAL LIGATION    . WOUND DEBRIDEMENT Right 03/04/2019   Procedure: DEBRIDEMENT WOUND;  Surgeon: Algernon Huxley, MD;  Location: [facility];  Service: Vascular;  Laterality: Right;     Social History   Tobacco Use  . Smoking status:  Current Every Day Smoker    Packs/day: 1.00    Years: 40.00    Pack years: 40.00    Types: Cigarettes  . Smokeless tobacco: Never Used  Substance Use Topics  . Alcohol use: Yes    Alcohol/week: 1.0 standard drinks    Types: 1 Standard drinks or equivalent per week    Comment: daily  . Drug use: Never      Family History  Problem Relation Age of Onset  . Pneumonia Father   . Heart failure Father     Allergies  Allergen Reactions  . Hydrocodone-Acetaminophen Swelling    Itching to arms and swelling to face and lips, improved with benadryl. HUSBAND STATES ALL NARCOTICS AFFECT PT THIS WAY    REVIEW OF SYSTEMS(Negative unless checked)  Constitutional: [] ??Weight loss[] ??Fever[] ??Chills Cardiac:[] ??Chest  pain[] ??Chest pressure[] ??Palpitations [] ??Shortness of breath when laying flat [] ??Shortness of breath at rest [] ??Shortness of breath with exertion. Vascular: [x] ??Pain in legs with walking[] ??Pain in legsat rest[] ??Pain in legs when laying flat [] ??Claudication [x] ??Pain in feet when walking [x] ??Pain in feet at rest [] ??Pain in feet when laying flat [] ??History of DVT [] ??Phlebitis [] ??Swelling in legs [] ??Varicose veins [x] ??Non-healing ulcers Pulmonary: [] ??Uses home oxygen [] ??Productive cough[] ??Hemoptysis [] ??Wheeze [] ??COPD [] ??Asthma Neurologic: [] ??Dizziness [] ??Blackouts [] ??Seizures [x] ??History of stroke [] ??History of TIA[] ??Aphasia [] ??Temporary blindness[] ??Dysphagia [] ??Weaknessor numbness in arms [] ??Weakness or numbnessin legs Musculoskeletal: [] ??Arthritis [] ??Joint swelling [] ??Joint pain [] ??Low back pain Hematologic:[] ??Easy bruising[] ??Easy bleeding [] ??Hypercoagulable state [] ??Anemic [] ??Hepatitis Gastrointestinal:[] ??Blood in stool[] ??Vomiting blood[] ??Gastroesophageal reflux/heartburn[] ??Difficulty swallowing  Genitourinary: [] ??Chronic kidney disease [] ??Difficulturination [] ??Frequenturination [] ??Burning with urination[] ??Blood in urine Skin: [] ??Rashes [x] ??Ulcers [x] ??Wounds Psychological: [] ??History of anxiety[] ??History of major depression.   Physical Examination  BP (!) 169/95 (BP Location: Left Arm)   Pulse 86   Resp 16  Gen:  WD/WN, NAD Head: [place]/AT, No temporalis wasting. Ear/Nose/Throat: Hearing grossly intact, nares w/o erythema or drainage Eyes: Conjunctiva clear. Sclera non-icteric Neck: Supple.  Trachea midline Pulmonary:  Good air movement, no use of accessory muscles.  Cardiac: irregular Vascular:  Vessel Right Left  Radial Palpable Palpable                       Musculoskeletal: M/S 5/5 throughout.  No deformity or  atrophy. 3+ BLE edema. Neurologic: Sensation grossly intact in extremities.  Symmetrical.  Speech is fluent.  Psychiatric: Judgment intact, Mood & affect appropriate for pt's clinical situation. Dermatologic: Right TMA wound is slowly healing but is clean with good granulation tissue.       Labs No results found for this or any previous visit (from the past 2160 hour(s)).  Radiology No results found.  Assessment/Plan  Atrial fibrillation with RVR (HCC) Rate controlled now.  On anticoagulation  Elevated BP without diagnosis of hypertension Likely white coat hypertension.  Atherosclerosis of native arteries of the extremities with gangrene (HCC) Wound is slowly healing.  Status post revascularization and doing well.  Plan to recheck in 3 months with noninvasive studies    , MD  10/12/2019 2:46 PM    This note was created with Dragon medical transcription system.  Any errors from dictation are purely unintentional

## 2019-10-12 NOTE — Assessment & Plan Note (Addendum)
Rate controlled now.  On anticoagulation This as well as her blood pressure and other issues will need to be followed by primary care physician which she currently does not have.  I want to put in a referral to one of our local primary care offices in hopes that they can get her in as a new patient in the next couple of months.

## 2019-10-12 NOTE — Assessment & Plan Note (Signed)
Likely white coat hypertension.

## 2019-12-07 ENCOUNTER — Ambulatory Visit: Payer: Self-pay | Admitting: Nurse Practitioner

## 2019-12-23 ENCOUNTER — Ambulatory Visit: Payer: Self-pay | Admitting: Nurse Practitioner

## 2020-01-03 ENCOUNTER — Ambulatory Visit: Payer: Self-pay | Admitting: Nurse Practitioner

## 2020-01-11 ENCOUNTER — Ambulatory Visit (INDEPENDENT_AMBULATORY_CARE_PROVIDER_SITE_OTHER): Payer: Medicare Other | Admitting: Vascular Surgery

## 2020-01-11 ENCOUNTER — Encounter (INDEPENDENT_AMBULATORY_CARE_PROVIDER_SITE_OTHER): Payer: Medicare Other

## 2020-02-15 ENCOUNTER — Ambulatory Visit (INDEPENDENT_AMBULATORY_CARE_PROVIDER_SITE_OTHER): Payer: Medicare Other | Admitting: Vascular Surgery

## 2020-02-15 ENCOUNTER — Encounter (INDEPENDENT_AMBULATORY_CARE_PROVIDER_SITE_OTHER): Payer: Medicare Other

## 2020-03-05 ENCOUNTER — Emergency Department: Payer: Medicare Other

## 2020-03-05 ENCOUNTER — Other Ambulatory Visit: Payer: Self-pay

## 2020-03-05 ENCOUNTER — Inpatient Hospital Stay
Admission: EM | Admit: 2020-03-05 | Discharge: 2020-03-16 | DRG: 208 | Disposition: E | Payer: Medicare Other | Attending: Pulmonary Disease | Admitting: Pulmonary Disease

## 2020-03-05 DIAGNOSIS — R739 Hyperglycemia, unspecified: Secondary | ICD-10-CM | POA: Diagnosis present

## 2020-03-05 DIAGNOSIS — J9621 Acute and chronic respiratory failure with hypoxia: Secondary | ICD-10-CM | POA: Diagnosis present

## 2020-03-05 DIAGNOSIS — Z7982 Long term (current) use of aspirin: Secondary | ICD-10-CM | POA: Diagnosis not present

## 2020-03-05 DIAGNOSIS — Z8249 Family history of ischemic heart disease and other diseases of the circulatory system: Secondary | ICD-10-CM | POA: Diagnosis not present

## 2020-03-05 DIAGNOSIS — N390 Urinary tract infection, site not specified: Secondary | ICD-10-CM | POA: Diagnosis present

## 2020-03-05 DIAGNOSIS — Z66 Do not resuscitate: Secondary | ICD-10-CM | POA: Diagnosis not present

## 2020-03-05 DIAGNOSIS — T8743 Infection of amputation stump, right lower extremity: Secondary | ICD-10-CM | POA: Diagnosis present

## 2020-03-05 DIAGNOSIS — Z20822 Contact with and (suspected) exposure to covid-19: Secondary | ICD-10-CM | POA: Diagnosis present

## 2020-03-05 DIAGNOSIS — I482 Chronic atrial fibrillation, unspecified: Secondary | ICD-10-CM | POA: Diagnosis present

## 2020-03-05 DIAGNOSIS — F419 Anxiety disorder, unspecified: Secondary | ICD-10-CM | POA: Diagnosis present

## 2020-03-05 DIAGNOSIS — Z9981 Dependence on supplemental oxygen: Secondary | ICD-10-CM

## 2020-03-05 DIAGNOSIS — Z8673 Personal history of transient ischemic attack (TIA), and cerebral infarction without residual deficits: Secondary | ICD-10-CM | POA: Diagnosis not present

## 2020-03-05 DIAGNOSIS — G9341 Metabolic encephalopathy: Secondary | ICD-10-CM | POA: Diagnosis present

## 2020-03-05 DIAGNOSIS — J42 Unspecified chronic bronchitis: Secondary | ICD-10-CM | POA: Diagnosis present

## 2020-03-05 DIAGNOSIS — H547 Unspecified visual loss: Secondary | ICD-10-CM | POA: Diagnosis present

## 2020-03-05 DIAGNOSIS — Z993 Dependence on wheelchair: Secondary | ICD-10-CM

## 2020-03-05 DIAGNOSIS — Z9114 Patient's other noncompliance with medication regimen: Secondary | ICD-10-CM | POA: Diagnosis not present

## 2020-03-05 DIAGNOSIS — F17210 Nicotine dependence, cigarettes, uncomplicated: Secondary | ICD-10-CM | POA: Diagnosis present

## 2020-03-05 DIAGNOSIS — I639 Cerebral infarction, unspecified: Secondary | ICD-10-CM | POA: Diagnosis present

## 2020-03-05 DIAGNOSIS — Z515 Encounter for palliative care: Secondary | ICD-10-CM | POA: Diagnosis not present

## 2020-03-05 DIAGNOSIS — J9620 Acute and chronic respiratory failure, unspecified whether with hypoxia or hypercapnia: Secondary | ICD-10-CM | POA: Diagnosis present

## 2020-03-05 DIAGNOSIS — R9389 Abnormal findings on diagnostic imaging of other specified body structures: Secondary | ICD-10-CM

## 2020-03-05 DIAGNOSIS — I119 Hypertensive heart disease without heart failure: Secondary | ICD-10-CM | POA: Diagnosis present

## 2020-03-05 DIAGNOSIS — Z7901 Long term (current) use of anticoagulants: Secondary | ICD-10-CM | POA: Diagnosis not present

## 2020-03-05 DIAGNOSIS — Z79891 Long term (current) use of opiate analgesic: Secondary | ICD-10-CM | POA: Diagnosis not present

## 2020-03-05 DIAGNOSIS — J690 Pneumonitis due to inhalation of food and vomit: Secondary | ICD-10-CM | POA: Diagnosis present

## 2020-03-05 DIAGNOSIS — J9601 Acute respiratory failure with hypoxia: Secondary | ICD-10-CM

## 2020-03-05 DIAGNOSIS — Z79899 Other long term (current) drug therapy: Secondary | ICD-10-CM | POA: Diagnosis not present

## 2020-03-05 LAB — BLOOD GAS, ARTERIAL
Acid-base deficit: 1.8 mmol/L (ref 0.0–2.0)
Bicarbonate: 23.7 mmol/L (ref 20.0–28.0)
FIO2: 1
MECHVT: 450 mL
O2 Saturation: 93.6 %
PEEP: 5 cmH2O
Patient temperature: 37
RATE: 16 resp/min
pCO2 arterial: 42 mmHg (ref 32.0–48.0)
pH, Arterial: 7.36 (ref 7.350–7.450)
pO2, Arterial: 72 mmHg — ABNORMAL LOW (ref 83.0–108.0)

## 2020-03-05 LAB — BLOOD GAS, VENOUS
Acid-Base Excess: 2.9 mmol/L — ABNORMAL HIGH (ref 0.0–2.0)
Bicarbonate: 29.9 mmol/L — ABNORMAL HIGH (ref 20.0–28.0)
O2 Saturation: 29.4 %
Patient temperature: 37
pCO2, Ven: 53 mmHg (ref 44.0–60.0)
pH, Ven: 7.36 (ref 7.250–7.430)
pO2, Ven: <31 mmHg — CL (ref 32.0–45.0)

## 2020-03-05 LAB — CBC WITH DIFFERENTIAL/PLATELET
Abs Immature Granulocytes: 0.06 10*3/uL (ref 0.00–0.07)
Basophils Absolute: 0.1 10*3/uL (ref 0.0–0.1)
Basophils Relative: 0 %
Eosinophils Absolute: 0 10*3/uL (ref 0.0–0.5)
Eosinophils Relative: 0 %
HCT: 46.2 % — ABNORMAL HIGH (ref 36.0–46.0)
Hemoglobin: 15.7 g/dL — ABNORMAL HIGH (ref 12.0–15.0)
Immature Granulocytes: 0 %
Lymphocytes Relative: 7 %
Lymphs Abs: 1 10*3/uL (ref 0.7–4.0)
MCH: 33.5 pg (ref 26.0–34.0)
MCHC: 34 g/dL (ref 30.0–36.0)
MCV: 98.7 fL (ref 80.0–100.0)
Monocytes Absolute: 0.8 10*3/uL (ref 0.1–1.0)
Monocytes Relative: 6 %
Neutro Abs: 11.7 10*3/uL — ABNORMAL HIGH (ref 1.7–7.7)
Neutrophils Relative %: 87 %
Platelets: 207 10*3/uL (ref 150–400)
RBC: 4.68 MIL/uL (ref 3.87–5.11)
RDW: 14.4 % (ref 11.5–15.5)
WBC: 13.6 10*3/uL — ABNORMAL HIGH (ref 4.0–10.5)
nRBC: 0 % (ref 0.0–0.2)

## 2020-03-05 LAB — COMPREHENSIVE METABOLIC PANEL
ALT: 13 U/L (ref 0–44)
AST: 22 U/L (ref 15–41)
Albumin: 3.6 g/dL (ref 3.5–5.0)
Alkaline Phosphatase: 84 U/L (ref 38–126)
Anion gap: 12 (ref 5–15)
BUN: 17 mg/dL (ref 8–23)
CO2: 26 mmol/L (ref 22–32)
Calcium: 8.7 mg/dL — ABNORMAL LOW (ref 8.9–10.3)
Chloride: 105 mmol/L (ref 98–111)
Creatinine, Ser: 0.88 mg/dL (ref 0.44–1.00)
GFR calc Af Amer: >60 mL/min (ref >60)
GFR calc non Af Amer: >60 mL/min (ref >60)
Glucose, Bld: 194 mg/dL — ABNORMAL HIGH (ref 70–99)
Potassium: 3.7 mmol/L (ref 3.5–5.1)
Sodium: 143 mmol/L (ref 135–145)
Total Bilirubin: 1.5 mg/dL — ABNORMAL HIGH (ref 0.3–1.2)
Total Protein: 6.8 g/dL (ref 6.5–8.1)

## 2020-03-05 LAB — TSH: TSH: 4.383 u[IU]/mL (ref 0.350–4.500)

## 2020-03-05 LAB — MRSA PCR SCREENING: MRSA by PCR: NEGATIVE

## 2020-03-05 LAB — CORTISOL: Cortisol, Plasma: 39.5 ug/dL

## 2020-03-05 LAB — TRIGLYCERIDES: Triglycerides: 123 mg/dL (ref <150)

## 2020-03-05 LAB — URINALYSIS, ROUTINE W REFLEX MICROSCOPIC
Bilirubin Urine: NEGATIVE
Glucose, UA: NEGATIVE mg/dL
Ketones, ur: 5 mg/dL — AB
Nitrite: POSITIVE — AB
Protein, ur: 100 mg/dL — AB
Specific Gravity, Urine: 1.018 (ref 1.005–1.030)
pH: 6 (ref 5.0–8.0)

## 2020-03-05 LAB — PHOSPHORUS: Phosphorus: 2.9 mg/dL (ref 2.5–4.6)

## 2020-03-05 LAB — LACTIC ACID, PLASMA
Lactic Acid, Venous: 2.9 mmol/L (ref 0.5–1.9)
Lactic Acid, Venous: 2.6 mmol/L (ref 0.5–1.9)
Lactic Acid, Venous: 3.4 mmol/L (ref 0.5–1.9)

## 2020-03-05 LAB — BRAIN NATRIURETIC PEPTIDE: B Natriuretic Peptide: 816.6 pg/mL — ABNORMAL HIGH (ref 0.0–100.0)

## 2020-03-05 LAB — PROTIME-INR
INR: 1 (ref 0.8–1.2)
Prothrombin Time: 12.9 seconds (ref 11.4–15.2)

## 2020-03-05 LAB — GLUCOSE, CAPILLARY: Glucose-Capillary: 162 mg/dL — ABNORMAL HIGH (ref 70–99)

## 2020-03-05 LAB — MAGNESIUM: Magnesium: 1.7 mg/dL (ref 1.7–2.4)

## 2020-03-05 LAB — APTT: aPTT: 24 seconds (ref 24–36)

## 2020-03-05 LAB — T4, FREE: Free T4: 0.81 ng/dL (ref 0.61–1.12)

## 2020-03-05 LAB — PROCALCITONIN: Procalcitonin: <0.1 ng/mL

## 2020-03-05 LAB — SARS CORONAVIRUS 2 BY RT PCR (HOSPITAL ORDER, PERFORMED IN ~~LOC~~ HOSPITAL LAB): SARS Coronavirus 2: NEGATIVE

## 2020-03-05 MED ORDER — VANCOMYCIN HCL 750 MG/150ML IV SOLN
750.0000 mg | Freq: Two times a day (BID) | INTRAVENOUS | Status: DC
  Administered 2020-03-06 – 2020-03-07 (×3): 750 mg via INTRAVENOUS
  Filled 2020-03-05 (×4): qty 150

## 2020-03-05 MED ORDER — MAGNESIUM SULFATE IN D5W 1-5 GM/100ML-% IV SOLN
1.0000 g | Freq: Once | INTRAVENOUS | Status: AC
  Administered 2020-03-06: 1 g via INTRAVENOUS
  Filled 2020-03-05 (×2): qty 100

## 2020-03-05 MED ORDER — POLYETHYLENE GLYCOL 3350 17 G PO PACK: 17.0000 g | PACK | Freq: Every day | ORAL | Status: DC | PRN

## 2020-03-05 MED ORDER — METOPROLOL TARTRATE 5 MG/5ML IV SOLN
2.5000 mg | Freq: Four times a day (QID) | INTRAVENOUS | Status: DC | PRN
  Administered 2020-03-06: 2.5 mg via INTRAVENOUS
  Administered 2020-03-06: 5 mg via INTRAVENOUS
  Filled 2020-03-05 (×2): qty 5

## 2020-03-05 MED ORDER — FENTANYL CITRATE (PF) 100 MCG/2ML IJ SOLN
50.0000 ug | INTRAMUSCULAR | Status: DC | PRN
  Filled 2020-03-05: qty 2

## 2020-03-05 MED ORDER — METHYLPREDNISOLONE SODIUM SUCC 40 MG IJ SOLR
40.0000 mg | Freq: Two times a day (BID) | INTRAMUSCULAR | Status: DC
  Administered 2020-03-05 – 2020-03-07 (×4): 40 mg via INTRAVENOUS
  Filled 2020-03-05 (×4): qty 1

## 2020-03-05 MED ORDER — PROPOFOL 1000 MG/100ML IV EMUL
INTRAVENOUS | Status: AC
  Administered 2020-03-05: 10.469 ug/kg/min via INTRAVENOUS
  Filled 2020-03-05: qty 100

## 2020-03-05 MED ORDER — PROPOFOL 1000 MG/100ML IV EMUL
0.0000 ug/kg/min | INTRAVENOUS | Status: DC
  Administered 2020-03-06 (×3): 35 ug/kg/min via INTRAVENOUS
  Administered 2020-03-06: 45 ug/kg/min via INTRAVENOUS
  Administered 2020-03-07: 35 ug/kg/min via INTRAVENOUS
  Filled 2020-03-05 (×6): qty 100

## 2020-03-05 MED ORDER — VANCOMYCIN HCL 500 MG/100ML IV SOLN
500.0000 mg | Freq: Once | INTRAVENOUS | Status: AC
  Administered 2020-03-05: 500 mg via INTRAVENOUS
  Filled 2020-03-05: qty 100

## 2020-03-05 MED ORDER — FENTANYL CITRATE (PF) 100 MCG/2ML IJ SOLN
50.0000 ug | INTRAMUSCULAR | Status: DC | PRN
  Administered 2020-03-08: 50 ug via INTRAVENOUS

## 2020-03-05 MED ORDER — LACTATED RINGERS IV SOLN: INTRAVENOUS | Status: DC

## 2020-03-05 MED ORDER — SODIUM CHLORIDE 0.9 % IV BOLUS (SEPSIS)
1000.0000 mL | Freq: Once | INTRAVENOUS | Status: AC
  Administered 2020-03-05: 1000 mL via INTRAVENOUS

## 2020-03-05 MED ORDER — ATORVASTATIN CALCIUM 10 MG PO TABS
10.0000 mg | ORAL_TABLET | Freq: Every day | ORAL | Status: DC
  Administered 2020-03-06: 10 mg via ORAL
  Filled 2020-03-05: qty 1

## 2020-03-05 MED ORDER — IPRATROPIUM-ALBUTEROL 0.5-2.5 (3) MG/3ML IN SOLN
3.0000 mL | Freq: Four times a day (QID) | RESPIRATORY_TRACT | Status: DC
  Administered 2020-03-05 – 2020-03-08 (×11): 3 mL via RESPIRATORY_TRACT
  Filled 2020-03-05 (×11): qty 3

## 2020-03-05 MED ORDER — ONDANSETRON HCL 4 MG/2ML IJ SOLN: 4.0000 mg | Freq: Four times a day (QID) | INTRAMUSCULAR | Status: DC | PRN

## 2020-03-05 MED ORDER — ASPIRIN 81 MG PO CHEW
324.0000 mg | CHEWABLE_TABLET | ORAL | Status: AC
  Administered 2020-03-06: 324 mg via ORAL
  Filled 2020-03-05: qty 4

## 2020-03-05 MED ORDER — VANCOMYCIN HCL IN DEXTROSE 1-5 GM/200ML-% IV SOLN
1000.0000 mg | Freq: Once | INTRAVENOUS | Status: AC
  Administered 2020-03-05: 1000 mg via INTRAVENOUS
  Filled 2020-03-05: qty 200

## 2020-03-05 MED ORDER — ETOMIDATE 2 MG/ML IV SOLN
15.0000 mg | Freq: Once | INTRAVENOUS | Status: AC
  Administered 2020-03-05: 15 mg via INTRAVENOUS

## 2020-03-05 MED ORDER — HEPARIN SODIUM (PORCINE) 5000 UNIT/ML IJ SOLN
5000.0000 [IU] | Freq: Three times a day (TID) | INTRAMUSCULAR | Status: DC
  Administered 2020-03-05 – 2020-03-07 (×5): 5000 [IU] via SUBCUTANEOUS
  Filled 2020-03-05 (×5): qty 1

## 2020-03-05 MED ORDER — ASPIRIN 300 MG RE SUPP: 300.0000 mg | RECTAL | Status: AC

## 2020-03-05 MED ORDER — CHLORHEXIDINE GLUCONATE 0.12% ORAL RINSE (MEDLINE KIT)
15.0000 mL | Freq: Two times a day (BID) | OROMUCOSAL | Status: DC
  Administered 2020-03-05 – 2020-03-08 (×6): 15 mL via OROMUCOSAL

## 2020-03-05 MED ORDER — INSULIN ASPART 100 UNIT/ML ~~LOC~~ SOLN
2.0000 [IU] | SUBCUTANEOUS | Status: DC
  Administered 2020-03-06 (×2): 2 [IU] via SUBCUTANEOUS
  Administered 2020-03-06 (×2): 4 [IU] via SUBCUTANEOUS
  Administered 2020-03-06: 2 [IU] via SUBCUTANEOUS
  Administered 2020-03-06 (×2): 4 [IU] via SUBCUTANEOUS
  Administered 2020-03-07 – 2020-03-08 (×7): 2 [IU] via SUBCUTANEOUS
  Filled 2020-03-05 (×14): qty 1

## 2020-03-05 MED ORDER — CHLORHEXIDINE GLUCONATE CLOTH 2 % EX PADS
6.0000 | MEDICATED_PAD | Freq: Every day | CUTANEOUS | Status: DC
  Administered 2020-03-05 – 2020-03-08 (×3): 6 via TOPICAL

## 2020-03-05 MED ORDER — ORAL CARE MOUTH RINSE
15.0000 mL | OROMUCOSAL | Status: DC
  Administered 2020-03-06 – 2020-03-08 (×23): 15 mL via OROMUCOSAL

## 2020-03-05 MED ORDER — DOCUSATE SODIUM 100 MG PO CAPS: 100.0000 mg | ORAL_CAPSULE | Freq: Two times a day (BID) | ORAL | Status: DC | PRN

## 2020-03-05 MED ORDER — ROCURONIUM BROMIDE 50 MG/5ML IV SOLN
80.0000 mg | Freq: Once | INTRAVENOUS | Status: AC
  Administered 2020-03-05: 80 mg via INTRAVENOUS
  Filled 2020-03-05: qty 8

## 2020-03-05 MED ORDER — SODIUM CHLORIDE 0.9 % IV SOLN
2.0000 g | Freq: Once | INTRAVENOUS | Status: AC
  Administered 2020-03-05: 2 g via INTRAVENOUS
  Filled 2020-03-05: qty 20

## 2020-03-05 MED ORDER — PANTOPRAZOLE SODIUM 40 MG IV SOLR
40.0000 mg | Freq: Every day | INTRAVENOUS | Status: DC
  Administered 2020-03-05 – 2020-03-06 (×2): 40 mg via INTRAVENOUS
  Filled 2020-03-05 (×2): qty 40

## 2020-03-05 MED ORDER — PIPERACILLIN-TAZOBACTAM 3.375 G IVPB
3.3750 g | Freq: Three times a day (TID) | INTRAVENOUS | Status: DC
  Administered 2020-03-05 – 2020-03-07 (×6): 3.375 g via INTRAVENOUS
  Filled 2020-03-05 (×7): qty 50

## 2020-03-05 NOTE — ED Provider Notes (Cosign Needed)
[facility] Emergency Department Provider Note ____________________________________________   None    (approximate)  I have reviewed the triage vital signs and the nursing notes.   HISTORY  Chief Complaint Altered Mental Status  HPI Krystal Herrera is a 79 y.o. female with a history of peripheral vascular disease, A. fib with RVR who presents to the emergency department for treatment and evaluation of altered mental status.  EMS reports that has been last saw her normal at about 1:00 this afternoon.  He states that she has been noncompliant with her medications for the past 6 months or so.  In May 2020 she had a transmetatarsal amputation and has had complications that have required debridement after wound dehiscence. She also had an episode of vomiting this afternoon just before she became altered.  EMS reports tachycardia, tachypnea, hypoxia down to 88 on home oxygen.  Nonrebreather brought her up to 96%     Past Medical History:  Diagnosis Date  . Anxiety   . Arthritis   . Blind   . Bronchitis    CHRONIC  . Complication of anesthesia 1985   bp, hr and breathing bottomed out during surgery-WITH THYROID SURGERY  . Dysrhythmia 01/21/2019  . Femoral artery occlusion, right (HCC)   . Hypertension    NO MEDS  . Stroke (HCC)    HUSBAND ASSUMES POSSIBLE STROKE LAST MONTH BUT UNSURE-PT HAS NUMBNESS IN RIGHT ARM HAND     Patient Active Problem List   Diagnosis Date Noted  . Swelling of limb 09/07/2019  . Phantom limb pain (HCC) 06/09/2019  . Visual impairment 05/06/2019  . Essential hypertension 04/14/2019  . Hyperlipidemia 04/14/2019  . PVD (peripheral vascular disease) (HCC) 04/14/2019  . Wound dehiscence, surgical, subsequent encounter 02/25/2019  . Gangrene of right foot (HCC) 02/02/2019  . Atrial fibrillation with RVR (HCC) 01/21/2019  . Tobacco use disorder 01/12/2019  . Elevated BP without diagnosis of hypertension 01/12/2019  .  Atherosclerosis of native arteries of the extremities with gangrene (HCC) 01/12/2019    Past Surgical History:  Procedure Laterality Date  . LOWER EXTREMITY ANGIOGRAPHY Right 01/22/2019   Procedure: LOWER EXTREMITY ANGIOGRAPHY;  Surgeon: Annice Needy, MD;  Location: [facility] INVASIVE CV LAB;  Service: Cardiovascular;  Laterality: Right;  . THYROID SURGERY    . THYROIDECTOMY, PARTIAL    . 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 TUBAL LIGATION    . WOUND DEBRIDEMENT Right 03/04/2019   Procedure: DEBRIDEMENT WOUND;  Surgeon: Annice Needy, MD;  Location: [facility] ORS;  Service: Vascular;  Laterality: Right;    Prior to Admission medications   Medication Sig Start Date End Date Taking? Authorizing Provider  Amino Acids-Protein Hydrolys (FEEDING SUPPLEMENT, PRO-STAT SUGAR FREE 64,) LIQD Take 30 mLs by mouth daily.    [provider]  apixaban (ELIQUIS) 5 MG TABS tablet Take 1 tablet (5 mg total) by mouth 2 (two) times daily. 01/23/19   Salary, Jetty Duhamel D, MD  aspirin EC 81 MG tablet Take 81 mg by mouth daily.    [provider]  atorvastatin (LIPITOR) 10 MG tablet Take 1 tablet (10 mg total) by mouth daily at 6 PM. 01/23/19   Salary, Evelena Asa, MD  diltiazem (CARDIZEM) 60 MG tablet Take 1 tablet (60 mg total) by mouth every 6 (six) hours. Patient taking differently: Take 30 mg by mouth 2 (two) times daily.  0800 and 2000 01/23/19   Salary, Avel Peace, MD  gabapentin (NEURONTIN) 300 MG capsule Take 1 capsule (300 mg total) by mouth 3 (three) times daily. 09/20/19   Kris Hartmann, NP  Multiple Vitamin (MULTIVITAMIN) capsule Take 1 capsule by mouth daily.    [provider]  senna-docusate (SENOKOT-S) 8.6-50 MG tablet Take 1 tablet by mouth 2 (two) times daily.    [provider]  traMADol (ULTRAM) 50 MG tablet Take 1 tablet (50 mg total) by mouth  every 8 (eight) hours as needed for moderate pain or severe pain. 02/03/19   Stegmayer, Joelene Millin A, PA-C  vitamin C (ASCORBIC ACID) 250 MG tablet Take 250 mg by mouth 2 (two) times daily.    [provider]  Zinc 50 MG TABS Take by mouth.    [provider]    Allergies Hydrocodone-acetaminophen  Family History  Problem Relation Age of Onset  . Pneumonia Father   . Heart failure Father     Social History Social History   Tobacco Use  . Smoking status: Current Every Day Smoker    Packs/day: 1.00    Years: 40.00    Pack years: 40.00    Types: Cigarettes  . Smokeless tobacco: Never Used  Vaping Use  . Vaping Use: Never used  Substance Use Topics  . Alcohol use: Yes    Alcohol/week: 1.0 standard drink    Types: 1 Standard drinks or equivalent per week    Comment: daily  . Drug use: Never    Review of Systems  Level 5 caveat: Altered mental status ____________________________________________   PHYSICAL EXAM:  VITAL SIGNS: See flow sheet.  Constitutional: Confused Eyes: Pupils 2 mm reactive to light. Head: Atraumatic. Nose: No congestion/rhinnorhea. Mouth/Throat: Mucous membranes are moist.  Oropharynx non-erythematous. Neck: No stridor.   Hematological/Lymphatic/Immunilogical: No cervical lymphadenopathy. Cardiovascular: Tachycardic. Grossly normal heart sounds.  Good peripheral circulation. Respiratory: Glennon Hamilton wheezing throughout with rhonchi in the left lower lobe lungs diminished throughout  gastrointestinal: Soft. Mild distention. No abdominal bruits.  Genitourinary:  Musculoskeletal: Partial amputation of the right foot. Neurologic: Awakens to voice and responds to pain. Mumbles when asked a question. Skin:  Skin is warm, dry and intact. No rash noted. Psychiatric: Mood and affect are normal. Speech and behavior are normal.  ____________________________________________   LABS (all labs ordered are listed, but only abnormal results  are displayed)  Labs Reviewed  LACTIC ACID, PLASMA - Abnormal; Notable for the following components:      Result Value   Lactic Acid, Venous 2.9 (*)    All other components within normal limits  COMPREHENSIVE METABOLIC PANEL - Abnormal; Notable for the following components:   Glucose, Bld 194 (*)    Calcium 8.7 (*)    Total Bilirubin 1.5 (*)    All other components within normal limits  CBC WITH DIFFERENTIAL/PLATELET - Abnormal; Notable for the following components:   WBC 13.6 (*)    Hemoglobin 15.7 (*)    HCT 46.2 (*)    Neutro Abs 11.7 (*)    All other components within normal limits  URINALYSIS, ROUTINE W REFLEX MICROSCOPIC - Abnormal; Notable for the following components:   Color, Urine AMBER (*)    APPearance CLOUDY (*)    Hgb urine dipstick MODERATE (*)    Ketones, ur 5 (*)    Protein, ur 100 (*)    Nitrite POSITIVE (*)    Leukocytes,Ua TRACE (*)    Bacteria, UA MANY (*)  All other components within normal limits  BLOOD GAS, VENOUS - Abnormal; Notable for the following components:   pO2, Ven <31.0 (*)    Bicarbonate 29.9 (*)    Acid-Base Excess 2.9 (*)    All other components within normal limits  CULTURE, BLOOD (ROUTINE X 2)  CULTURE, BLOOD (ROUTINE X 2)  URINE CULTURE  AEROBIC/ANAEROBIC CULTURE (SURGICAL/DEEP WOUND)  SARS CORONAVIRUS 2 BY RT PCR (HOSPITAL ORDER, PERFORMED IN [facility])  APTT  PROTIME-INR  LACTIC ACID, PLASMA  TRIGLYCERIDES   ____________________________________________  EKG  ED ECG REPORT I, Mabry Santarelli, FNP-BC personally viewed and interpreted this ECG.   Date: 2020-03-30  EKG Time: 1532  Rate: 125  Rhythm: atrial fibrillation  Axis: normal  Intervals: Prolonged QT  ST&T Change: no ST elevation  ____________________________________________  RADIOLOGY  ED MD interpretation:    Widespread opacities bilaterally  I, Chemeka Filice, personally viewed and evaluated these images (plain radiographs) as part of my  medical decision making, as well as reviewing the written report by the radiologist.  Official radiology report(s): DG Chest Port 1 View  Result Date: 03/30/2020 CLINICAL DATA:  Hypoxia EXAM: PORTABLE CHEST 1 VIEW COMPARISON:  Chest radiograph dated 01/21/2019 FINDINGS: The heart is borderline enlarged accounting for technique. Moderate to severe diffuse bilateral interstitial and airspace opacities are noted. There is no pleural effusion or pneumothorax. Degenerative changes are seen in the right shoulder and spine. IMPRESSION: Moderate to severe diffuse bilateral interstitial and airspace opacities, likely representing pulmonary edema versus multifocal pneumonia. Electronically Signed   By: Romona Curls M.D.   On: 2020/03/30 15:59    ____________________________________________   PROCEDURES  Procedure(s) performed (including Critical Care):  .Critical Care Performed by: Chinita Pester, FNP Authorized by: Chinita Pester, FNP   Critical care provider statement:    Critical care time (minutes):  30   Critical care time was exclusive of:  Separately billable procedures and treating other patients   Critical care was necessary to treat or prevent imminent or life-threatening deterioration of the following conditions:  Cardiac failure and respiratory failure   Critical care was time spent personally by me on the following activities:  Ordering and performing treatments and interventions, ordering and review of laboratory studies, ordering and review of radiographic studies, pulse oximetry, re-evaluation of patient's condition, examination of patient, evaluation of patient's response to treatment and discussions with consultants    ____________________________________________   INITIAL IMPRESSION / ASSESSMENT AND PLAN     79 year old female presenting to emergency department for treatment and evaluation for symptoms as described in the HPI.  Patient does appear to be septic most  likely from the surgical wound in her right foot after amputation.  Has been reported to EMS that she has not been taking her medications for more than 6 months.  No DNR or advanced directives that we are aware of.  DIFFERENTIAL DIAGNOSIS  Sepsis, CVA, Aspiration Pneumonia  ED COURSE  Patient noted to be tachypneic and hypoxic even with nonrebreather.  She is 85% with 10 L via nonrebreather.  Will require intubation. Plan will be to have a discussion with her husband regarding her wishes.   Dr. Erma Heritage has discussed plan with the husband. Plan will be to attempt PPV for 10 minutes or so before intubation.  ----------------------------------------- 5:02 PM on 2020-03-30 -----------------------------------------  Some improvement with Bipap. Up to 94%.   ----------------------------------------- 5:31 PM on 03/30/20 -----------------------------------------  Patient had pulled her BiPAP mask off and her saturation  was down into the 50s.  Intubation completed by Dr. Erma Heritage.  Dr. Jayme Cloud contacted for ICU admission.  Fluids are infusing and broad-spectrum antibiotics are running as well.  Oxygen saturation is 95% at this time. ____________________________________________   FINAL CLINICAL IMPRESSION(S) / ED DIAGNOSES  Final diagnoses:  Sepsis with acute hypoxic respiratory failure and septic shock, due to unspecified organism [facility])     ED Discharge Orders    None       Krystal Herrera was evaluated in Emergency Department on 03/07/2020 for the symptoms described in the history of present illness. She was evaluated in the context of the global COVID-19 pandemic, which necessitated consideration that the patient might be at risk for infection with the SARS-CoV-2 virus that causes COVID-19. Institutional protocols and algorithms that pertain to the evaluation of patients at risk for COVID-19 are in a state of rapid change based on information released by regulatory bodies including the  CDC and federal and state organizations. These policies and algorithms were followed during the patient's care in the ED.   Note:  This document was prepared using Dragon voice recognition software and may include unintentional dictation errors.   Chinita Pester, FNP 03/14/2020 1746

## 2020-03-05 NOTE — ED Notes (Signed)
Pinpoint pupils noted.  

## 2020-03-05 NOTE — Consult Note (Signed)
CODE SEPSIS - PHARMACY COMMUNICATION  **Broad Spectrum Antibiotics should be administered within 1 hour of Sepsis diagnosis**  Time Code Sepsis Called/Page Received: 1538   Antibiotics Ordered: Vancomycin/Ceftriaxone  Time of 1st antibiotic administration: 1602  Additional action taken by pharmacy: None  If necessary, Name of Provider/Nurse Contacted: N/A    Albina Billet ,PharmD Clinical Pharmacist  2020/04/01  4:02 PM

## 2020-03-05 NOTE — ED Notes (Signed)
Pt continuously removes bipap, oxygen dropped to 55%. MD at bedside, RT at bedside, 2 RN's at bedside for intubation.

## 2020-03-05 NOTE — H&P (Signed)
Name: Krystal Herrera MRN: [id number] DOB: [birth year]    ADMISSION DATE:  03/08/2020 CONSULTATION DATE: 03/04/2020  REFERRING MD : Dr. Vanessa Hoisington  CHIEF COMPLAINT: Altered Mental Status   BRIEF PATIENT DESCRIPTION: 79 yo female admitted with right transmetatarsal amputation wound infection, acute encephalopathy, and acute on chronic hypoxic respiratory failure secondary to aspiration pneumonia requiring mechanical intubation   SIGNIFICANT EVENTS/STUDIES:  06/20: Pt admitted to ICU mechanically intubated   HISTORY OF PRESENT ILLNESS:   This is a 79 yo female with a PMH of CVA, HTN, Atrial Fibrillation with RVR, Right Femoral Artery Occlusion, Dysrhythmia, Chronic Bronchitis, Blindness, Arthritis, and Anxiety.  She presented to [facility] ER on 06/20 via EMS from home with altered mental status following a vomiting episode. Per ER notes her baseline mentation was on 06/20 at 1300. Her husband also reported the pt has been noncompliant with her medications for the past 6 months or so.  She did have a transmetatarsal amputation, which was complicated by wound dehiscence requiring debridement.  EMS reported upon their arrival at pts home she was tachycardic, tachypneic, and hypoxic on her chronic home O2 requiring NRB with O2 sats increasing to 96%.  In the ER pts O2 sats were 85% on 10L via NRB, therefore pt transitioned to Bipap.  However, she became agitated pulling Bipap mask off resulting in severe hypoxia with O2 sats in the 50's requiring mechanical intubation.  Lab results revealed glucose 194, calcium 8.7, BNP 816.6, lactic acid 2.9, wbc 13.6, pct <0.10, and UA positive for UTI.  COVID-19 negative, however CXR concerning for pulmonary edema vs. pneumonia.  She received ceftriaxone, vancomycin, and 1L NS bolus.  PCCM team contacted for ICU admission.  PAST MEDICAL HISTORY :   has a past medical history of Anxiety, Arthritis, Blind, Bronchitis, Complication of anesthesia (1985), Dysrhythmia  (01/21/2019), Femoral artery occlusion, right ([place]), Hypertension, and Stroke ([place]).  has a past surgical history that includes Thyroid surgery; Thyroidectomy, partial; Tubal ligation; Lower Extremity Angiography (Right, 01/22/2019); Transmetatarsal amputation (Right, 02/02/2019); and Wound debridement (Right, 03/04/2019). Prior to Admission medications   Medication Sig Start Date End Date Taking? Authorizing Provider  aspirin EC 81 MG tablet Take 81 mg by mouth daily.    [provider]  atorvastatin (LIPITOR) 10 MG tablet Take 1 tablet (10 mg total) by mouth daily at 6 PM. 01/23/19   Salary, Holly Bodily D, MD  diltiazem (CARDIZEM) 30 MG tablet Take 30 mg by mouth 2 (two) times daily. 10/21/19   [provider]   Allergies  Allergen Reactions   Hydrocodone-Acetaminophen Swelling    Itching to arms and swelling to face and lips, improved with benadryl. HUSBAND STATES ALL NARCOTICS AFFECT PT THIS WAY    FAMILY HISTORY:  family history includes Heart failure in her father; Pneumonia in her father. SOCIAL HISTORY:  reports that she has been smoking cigarettes. She has a 40.00 pack-year smoking history. She has never used smokeless tobacco. She reports current alcohol use of about 1.0 standard drink of alcohol per week. She reports that she does not use drugs.  REVIEW OF SYSTEMS:   Unable to assess pt intubated   SUBJECTIVE:  Unable to assess pt intubated   VITAL SIGNS: Temp:  [88.9 F (31.6 C)-97.5 F (36.4 C)] 97 F (36.1 C) (06/20 1930) Pulse Rate:  [25-151] 116 (06/20 1930) Resp:  [14-42] 25 (06/20 1930) BP: (119-180)/(83-142) 166/136 (06/20 1930) SpO2:  [67 %-100 %] 99 % (06/20 1930) FiO2 (%):  [100 %] 100 % (06/20  1715) Weight:  [79.6 kg] 79.6 kg (06/20 1538)  PHYSICAL EXAMINATION: General: acutely ill appearing female, NAD mechanically intubated Neuro: sedated, not following commands, moves all extremities, PERRL HEENT: supple, no JVD  Cardiovascular: irregular  irregular, no R/G, 2+ bilateral lower extremity edema  Lungs: diffuse rhonchi throughout, even, non labored  Abdomen: +BS x4, obese, soft, non distended  Musculoskeletal: right transmetatarsal amputation  Skin: right transmetatarsal wound with moderate amount of pus draining from incision site   Recent Labs  Lab 02/17/2020 1541  NA 143  K 3.7  CL 105  CO2 26  BUN 17  CREATININE 0.88  GLUCOSE 194*   Recent Labs  Lab 03/02/2020 1541  HGB 15.7*  HCT 46.2*  WBC 13.6*  PLT 207   DG Chest 1 View  Result Date: 02/15/2020 CLINICAL DATA:  79 year old female status post intubation. EXAM: CHEST  1 VIEW; ABDOMEN - 1 VIEW COMPARISON:  Earlier radiograph dated 02/23/2020. FINDINGS: Endotracheal tube with tip approximately 3 cm above the carina. Enteric tube extends below the diaphragm with tip in the body of the stomach. Diffuse interstitial prominence with Kerley B-lines consistent with edema. Interval worsening of the airspace opacity involving the right mid to lower lung field which may represent worsening edema although pneumonia is not excluded. No large pleural effusion or pneumothorax. Mild cardiomegaly. Atherosclerotic calcification of the aorta. There is gaseous distention of the stomach. Degenerative changes of the spine. No acute osseous pathology. IMPRESSION: 1. Endotracheal tube above the carina. Enteric tube extends below the diaphragm with tip in the body of the stomach. 2. Interval worsening of airspace density in the right lung. Electronically Signed   By: Anner Crete M.D.   On: 02/26/2020 18:06   DG Abdomen 1 View  Result Date: 02/16/2020 CLINICAL DATA:  79 year old female status post intubation. EXAM: CHEST  1 VIEW; ABDOMEN - 1 VIEW COMPARISON:  Earlier radiograph dated 03/06/2020. FINDINGS: Endotracheal tube with tip approximately 3 cm above the carina. Enteric tube extends below the diaphragm with tip in the body of the stomach. Diffuse interstitial prominence with Kerley  B-lines consistent with edema. Interval worsening of the airspace opacity involving the right mid to lower lung field which may represent worsening edema although pneumonia is not excluded. No large pleural effusion or pneumothorax. Mild cardiomegaly. Atherosclerotic calcification of the aorta. There is gaseous distention of the stomach. Degenerative changes of the spine. No acute osseous pathology. IMPRESSION: 1. Endotracheal tube above the carina. Enteric tube extends below the diaphragm with tip in the body of the stomach. 2. Interval worsening of airspace density in the right lung. Electronically Signed   By: Anner Crete M.D.   On: 03/04/2020 18:06   DG Chest Port 1 View  Result Date: 03/04/2020 CLINICAL DATA:  Hypoxia EXAM: PORTABLE CHEST 1 VIEW COMPARISON:  Chest radiograph dated 01/21/2019 FINDINGS: The heart is borderline enlarged accounting for technique. Moderate to severe diffuse bilateral interstitial and airspace opacities are noted. There is no pleural effusion or pneumothorax. Degenerative changes are seen in the right shoulder and spine. IMPRESSION: Moderate to severe diffuse bilateral interstitial and airspace opacities, likely representing pulmonary edema versus multifocal pneumonia. Electronically Signed   By: Zerita Boers M.D.   On: 03/04/2020 15:59    ASSESSMENT / PLAN:  Acute on chronic hypoxic respiratory failure secondary to aspiration pneumonia  Mechanical Intubation  Hx: Chronic Bronchitis Full vent support for now-vent settings reviewed and established SBT once all parameters met VAP bundle implemented  IV steroids  Prn  bronchodilator therapy   Chronic atrial fibrillation  Hx: Dysrhythmias, HTN, and CVA  Continuous telemetry monitoring  Continue outpatient aspirin and atorvastatin  Hold outpatient cardizem due to soft bp's  Prn metoprolol for heart rate management Continue maintenance fluids   Leukocytosis secondary to aspiration pneumonia, RLE wound  infection,  and UTI  Trend WBC and monitor fever curve  Trend PCT and lactic acid  Follow cultures  Continue vancomycin and zosyn for now  Wound care consulted appreciate input   Hyperglycemia  CBG's  SSI  Hemoglobin A1c pending    Acute encephalopathy likely secondary to hypoxia and infectious process   Mechanical intubation pain/discomfort  Maintain RASS goal -1 to -2 while intubated  Continue propofol gtt and prn fentanyl for pain management  WUA daily   Best Practice: VTE px: subq heparin  SUP px: iv protonix  Diet: keep NPO for now if pt remains intubated in the next 24hrs will start TF's  Marda Stalker, [place] Pager [phone] (please enter 7 digits) PCCM Consult Pager [phone] (please enter 7 digits)

## 2020-03-05 NOTE — ED Notes (Signed)
Pt intubated at this time with 7.5 french 23 at the lip. Positive color change and rise and fall of chest noted. Bilateral breath sounds noted.

## 2020-03-05 NOTE — Consult Note (Addendum)
PHARMACY CONSULT NOTE - FOLLOW UP  Pharmacy Consult for Electrolyte Monitoring and Replacement   Recent Labs: Potassium (mmol/L)  Date Value  Mar 08, 2020 3.7   Magnesium (mg/dL)  Date Value  09/81/1914 1.7   Calcium (mg/dL)  Date Value  78/29/5621 8.7 (L)   Albumin (g/dL)  Date Value  30/86/5784 3.6   Phosphorus (mg/dL)  Date Value  69/62/9528 2.9   Sodium (mmol/L)  Date Value  March 08, 2020 143     Assessment: 79 yo female with AMS/sepsis.    Pharmacy has been consulted for CCM monitoring and electrolyte management.  Goal of Therapy:  Electrolytes wnl's  Plan:  Will give Mg sulfate 1g IV x 1 and recheck BMP/Mg/Phos with am labs   Albina Billet ,PharmD Clinical Pharmacist 2020/03/08 9:55 PM

## 2020-03-05 NOTE — Consult Note (Signed)
Pharmacy Antibiotic Note  Krystal Herrera is a 79 y.o. female admitted on 02/25/2020 with sepsis.  Pharmacy has been consulted for Vancomycin/Zosyn dosing.  Plan: Zosyn 3.375g IV q8h (4 hour infusion).   Pt received Vancomycin 1500mg  loading dose in the ED, will follow with Vancomycin 750mg  q12h per nomogram  Height: 5\' 8"  (172.7 cm) Weight: 79.6 kg (175 lb 7.8 oz) IBW/kg (Calculated) : 63.9  Temp (24hrs), Avg:96.4 F (35.8 C), Min:88.9 F (31.6 C), Max:97.5 F (36.4 C)  Recent Labs  Lab 02/16/2020 1541 03/02/2020 1731  WBC 13.6*  --   CREATININE 0.88  --   LATICACIDVEN 2.9* 2.6*    Estimated Creatinine Clearance: 58.4 mL/min (by C-G formula based on SCr of 0.88 mg/dL).    Allergies  Allergen Reactions  . Hydrocodone-Acetaminophen Swelling    Itching to arms and swelling to face and lips, improved with benadryl. HUSBAND STATES ALL NARCOTICS AFFECT PT THIS WAY    Antimicrobials this admission: 6/20 Ceftriaxone 2g x 1  Vancomycin 6/20 >> Zosyn 6/20 >>  Dose adjustments this admission: None  Microbiology results: 6/20 BCx: pending 6/20 UCx: pending  6/20 Sputum/Tracheal aspirate: pending 6/20 Wnd Cx pending COVID NEG  Thank you for allowing pharmacy to be a part of this patient's care.  7/20, PharmD, BCPS Clinical Pharmacist 03/01/2020 8:12 PM

## 2020-03-05 NOTE — ED Triage Notes (Signed)
Pt EMS, pt was LSN by husband at 1300 today, pt has been noncompliant with medications for last 6 months and is sepsis alert by EMS. HR 80-160's, BGL 256, and oxygen 88% upon EMS arrival. Pt has left sided weakness from previous CVA. EMS applied non rebreather and gave 5 mg metoprolol and 500 ml's normal saline.  Pt is arousalable to touch, mumbles and moans incomprehensibly. Right foot amputation appears infected. 2+ edema pitting in lower extremities.

## 2020-03-05 NOTE — ED Notes (Signed)
30 ml's clear yellow urine noted in drainage bag at this time.

## 2020-03-05 NOTE — Consult Note (Signed)
PHARMACY -  BRIEF ANTIBIOTIC NOTE   Pharmacy has received consult(s) for Vancomycin from an ED provider.  The patient's profile has been reviewed for ht/wt/allergies/indication/available labs.    One time order(s) placed for Vancomycin IV 500mg  (with ED ordered 1000mg  for a total load of 1500mg )  Further antibiotics/pharmacy consults should be ordered by admitting physician if indicated.                       Thank you,  , PharmD, BCPS Clinical Pharmacist 03-18-2020 4:05 PM

## 2020-03-05 NOTE — ED Notes (Signed)
Pt mouth suctioned. Copious drool and white phlegm noted. In-line suctioning performed.

## 2020-03-05 NOTE — Progress Notes (Signed)
Notified bedside nurse of need to draw repeat lactic acid. 

## 2020-03-06 ENCOUNTER — Inpatient Hospital Stay: Payer: Medicare Other

## 2020-03-06 LAB — GLUCOSE, CAPILLARY
Glucose-Capillary: 122 mg/dL — ABNORMAL HIGH (ref 70–99)
Glucose-Capillary: 133 mg/dL — ABNORMAL HIGH (ref 70–99)
Glucose-Capillary: 156 mg/dL — ABNORMAL HIGH (ref 70–99)
Glucose-Capillary: 160 mg/dL — ABNORMAL HIGH (ref 70–99)
Glucose-Capillary: 164 mg/dL — ABNORMAL HIGH (ref 70–99)
Glucose-Capillary: 172 mg/dL — ABNORMAL HIGH (ref 70–99)
Glucose-Capillary: 194 mg/dL — ABNORMAL HIGH (ref 70–99)

## 2020-03-06 LAB — BASIC METABOLIC PANEL
Anion gap: 12 (ref 5–15)
BUN: 19 mg/dL (ref 8–23)
CO2: 23 mmol/L (ref 22–32)
Calcium: 8.4 mg/dL — ABNORMAL LOW (ref 8.9–10.3)
Chloride: 107 mmol/L (ref 98–111)
Creatinine, Ser: 0.99 mg/dL (ref 0.44–1.00)
GFR calc Af Amer: >60 mL/min (ref >60)
GFR calc non Af Amer: 55 mL/min — ABNORMAL LOW (ref >60)
Glucose, Bld: 180 mg/dL — ABNORMAL HIGH (ref 70–99)
Potassium: 3.4 mmol/L — ABNORMAL LOW (ref 3.5–5.1)
Sodium: 142 mmol/L (ref 135–145)

## 2020-03-06 LAB — CBC
HCT: 47.1 % — ABNORMAL HIGH (ref 36.0–46.0)
Hemoglobin: 16.2 g/dL — ABNORMAL HIGH (ref 12.0–15.0)
MCH: 33.7 pg (ref 26.0–34.0)
MCHC: 34.4 g/dL (ref 30.0–36.0)
MCV: 97.9 fL (ref 80.0–100.0)
Platelets: 186 10*3/uL (ref 150–400)
RBC: 4.81 MIL/uL (ref 3.87–5.11)
RDW: 14.4 % (ref 11.5–15.5)
WBC: 19.2 10*3/uL — ABNORMAL HIGH (ref 4.0–10.5)
nRBC: 0 % (ref 0.0–0.2)

## 2020-03-06 LAB — MAGNESIUM: Magnesium: 2 mg/dL (ref 1.7–2.4)

## 2020-03-06 LAB — PHOSPHORUS: Phosphorus: 3.4 mg/dL (ref 2.5–4.6)

## 2020-03-06 MED ORDER — AMIODARONE HCL IN DEXTROSE 360-4.14 MG/200ML-% IV SOLN
30.0000 mg/h | INTRAVENOUS | Status: DC
  Administered 2020-03-06 – 2020-03-08 (×4): 30 mg/h via INTRAVENOUS
  Filled 2020-03-06 (×3): qty 200

## 2020-03-06 MED ORDER — POTASSIUM CHLORIDE 10 MEQ/100ML IV SOLN
10.0000 meq | INTRAVENOUS | Status: AC
  Administered 2020-03-06 (×4): 10 meq via INTRAVENOUS
  Filled 2020-03-06 (×4): qty 100

## 2020-03-06 MED ORDER — LACTATED RINGERS IV SOLN
INTRAVENOUS | Status: DC
  Administered 2020-03-07: 75 mL/h via INTRAVENOUS

## 2020-03-06 MED ORDER — AMIODARONE LOAD VIA INFUSION
150.0000 mg | Freq: Once | INTRAVENOUS | Status: AC
  Administered 2020-03-06: 150 mg via INTRAVENOUS
  Filled 2020-03-06: qty 83.34

## 2020-03-06 MED ORDER — POTASSIUM CHLORIDE 20 MEQ PO PACK
40.0000 meq | PACK | Freq: Once | ORAL | Status: AC
  Administered 2020-03-06: 40 meq
  Filled 2020-03-06: qty 2

## 2020-03-06 MED ORDER — AMIODARONE HCL IN DEXTROSE 360-4.14 MG/200ML-% IV SOLN
60.0000 mg/h | INTRAVENOUS | Status: AC
  Administered 2020-03-06 (×2): 60 mg/h via INTRAVENOUS
  Filled 2020-03-06 (×2): qty 200

## 2020-03-06 MED ORDER — AMIODARONE LOAD VIA INFUSION
150.0000 mg | Freq: Once | INTRAVENOUS | Status: DC
  Filled 2020-03-06: qty 83.34

## 2020-03-06 NOTE — Progress Notes (Signed)
Name: Krystal Herrera MRN: [id number] DOB: 12-Aug-1941    ADMISSION DATE:  03/04/2020 CONSULTATION DATE: 03/12/2020  REFERRING MD : Dr. Vanessa Francesville  CHIEF COMPLAINT: Altered Mental Status   BRIEF PATIENT DESCRIPTION: 79 yo female admitted with right transmetatarsal amputation wound infection, acute encephalopathy, and acute on chronic hypoxic respiratory failure secondary to aspiration pneumonia requiring mechanical intubation   SIGNIFICANT EVENTS/STUDIES:  06/20: Pt admitted to ICU mechanically intubated   HISTORY OF PRESENT ILLNESS:   This is a 79 yo female with a PMH of CVA, HTN, Atrial Fibrillation with RVR, Right Femoral Artery Occlusion, Dysrhythmia, Chronic Bronchitis, Blindness, Arthritis, and Anxiety.  She presented to [facility] ER on 06/20 via EMS from home with altered mental status following a vomiting episode. Per ER notes her baseline mentation was on 06/20 at 1300. Her husband also reported the pt has been noncompliant with her medications for the past 6 months or so.  She did have a transmetatarsal amputation, which was complicated by wound dehiscence requiring debridement.  EMS reported upon their arrival at pts home she was tachycardic, tachypneic, and hypoxic on her chronic home O2 requiring NRB with O2 sats increasing to 96%.  In the ER pts O2 sats were 85% on 10L via NRB, therefore pt transitioned to Bipap.  However, she became agitated pulling Bipap mask off resulting in severe hypoxia with O2 sats in the 50's requiring mechanical intubation.  Lab results revealed glucose 194, calcium 8.7, BNP 816.6, lactic acid 2.9, wbc 13.6, pct <0.10, and UA positive for UTI.  COVID-19 negative, however CXR concerning for pulmonary edema vs. pneumonia.  She received ceftriaxone, vancomycin, and 1L NS bolus.  PCCM team contacted for ICU admission.   Events:  03/06/20- patient with AFrVR started on amio with rate control, urine OP improved. Remains intubated, SBT in am.    PAST MEDICAL HISTORY :    has a past medical history of Anxiety, Arthritis, Blind, Bronchitis, Complication of anesthesia (1985), Dysrhythmia (01/21/2019), Femoral artery occlusion, right ([place]), Hypertension, and Stroke ([place]).  has a past surgical history that includes Thyroid surgery; Thyroidectomy, partial; Tubal ligation; Lower Extremity Angiography (Right, 01/22/2019); Transmetatarsal amputation (Right, 02/02/2019); and Wound debridement (Right, 03/04/2019). Prior to Admission medications   Medication Sig Start Date End Date Taking? Authorizing Provider  aspirin EC 81 MG tablet Take 81 mg by mouth daily.    [provider]  atorvastatin (LIPITOR) 10 MG tablet Take 1 tablet (10 mg total) by mouth daily at 6 PM. 01/23/19   Salary, Holly Bodily D, MD  diltiazem (CARDIZEM) 30 MG tablet Take 30 mg by mouth 2 (two) times daily. 10/21/19   [provider]   Allergies  Allergen Reactions  . Hydrocodone-Acetaminophen Swelling    Itching to arms and swelling to face and lips, improved with benadryl. HUSBAND STATES ALL NARCOTICS AFFECT PT THIS WAY    FAMILY HISTORY:  family history includes Heart failure in her father; Pneumonia in her father. SOCIAL HISTORY:  reports that she has been smoking cigarettes. She has a 40.00 pack-year smoking history. She has never used smokeless tobacco. She reports current alcohol use of about 1.0 standard drink of alcohol per week. She reports that she does not use drugs.  REVIEW OF SYSTEMS:   Unable to assess pt intubated   SUBJECTIVE:  Unable to assess pt intubated   VITAL SIGNS: Temp:  [88.9 F (31.6 C)-99.1 F (37.3 C)] 97.3 F (36.3 C) (06/21 1500) Pulse Rate:  [25-148] 109 (06/21 1500) Resp:  [16-34] 18 (  06/21 1500) BP: (77-180)/(65-142) 77/65 (06/21 1500) SpO2:  [92 %-100 %] 94 % (06/21 1531) FiO2 (%):  [24 %-100 %] 24 % (06/21 1531) Weight:  [76.5 kg] 76.5 kg (06/21 0440)  PHYSICAL EXAMINATION: General: acutely ill appearing female, NAD mechanically  intubated Neuro: GCS4T HEENT: supple, no JVD  Cardiovascular: irregular irregular, no R/G, 2+ bilateral lower extremity edema  Lungs: diffuse rhonchi throughout, even, non labored  Abdomen: +BS x4, obese, soft, non distended  Musculoskeletal: right transmetatarsal amputation  Skin: right transmetatarsal wound with moderate amount of pus draining from incision site   Recent Labs  Lab 03/02/2020 1541 03/06/20 0504  NA 143 142  K 3.7 3.4*  CL 105 107  CO2 26 23  BUN 17 19  CREATININE 0.88 0.99  GLUCOSE 194* 180*   Recent Labs  Lab 02/27/2020 1541 03/06/20 0504  HGB 15.7* 16.2*  HCT 46.2* 47.1*  WBC 13.6* 19.2*  PLT 207 186   DG Chest 1 View  Result Date: 02/19/2020 CLINICAL DATA:  79 year old female status post intubation. EXAM: CHEST  1 VIEW; ABDOMEN - 1 VIEW COMPARISON:  Earlier radiograph dated 02/24/2020. FINDINGS: Endotracheal tube with tip approximately 3 cm above the carina. Enteric tube extends below the diaphragm with tip in the body of the stomach. Diffuse interstitial prominence with Kerley B-lines consistent with edema. Interval worsening of the airspace opacity involving the right mid to lower lung field which may represent worsening edema although pneumonia is not excluded. No large pleural effusion or pneumothorax. Mild cardiomegaly. Atherosclerotic calcification of the aorta. There is gaseous distention of the stomach. Degenerative changes of the spine. No acute osseous pathology. IMPRESSION: 1. Endotracheal tube above the carina. Enteric tube extends below the diaphragm with tip in the body of the stomach. 2. Interval worsening of airspace density in the right lung. Electronically Signed   By: Anner Crete M.D.   On: 03/04/2020 18:06   DG Abdomen 1 View  Result Date: 02/25/2020 CLINICAL DATA:  79 year old female status post intubation. EXAM: CHEST  1 VIEW; ABDOMEN - 1 VIEW COMPARISON:  Earlier radiograph dated 02/18/2020. FINDINGS: Endotracheal tube with tip  approximately 3 cm above the carina. Enteric tube extends below the diaphragm with tip in the body of the stomach. Diffuse interstitial prominence with Kerley B-lines consistent with edema. Interval worsening of the airspace opacity involving the right mid to lower lung field which may represent worsening edema although pneumonia is not excluded. No large pleural effusion or pneumothorax. Mild cardiomegaly. Atherosclerotic calcification of the aorta. There is gaseous distention of the stomach. Degenerative changes of the spine. No acute osseous pathology. IMPRESSION: 1. Endotracheal tube above the carina. Enteric tube extends below the diaphragm with tip in the body of the stomach. 2. Interval worsening of airspace density in the right lung. Electronically Signed   By: Anner Crete M.D.   On: 03/13/2020 18:06   DG Chest Port 1 View  Result Date: 03/06/2020 CLINICAL DATA:  Chronic bronchitis, sepsis EXAM: PORTABLE CHEST 1 VIEW COMPARISON:  03/06/2020 FINDINGS: Endotracheal tube with the tip 4.2 cm above the carina. Nasogastric tube coursing below the diaphragm. Diffuse bilateral interstitial thickening with patchy alveolar airspace opacities most severe in the right upper lobe and right lower lobe. Likely trace right pleural effusion. No pneumothorax. Stable cardiomediastinal silhouette. No aggressive osseous lesion. IMPRESSION: 1. Support lines and tubing in satisfactory position. 2. Bilateral interstitial thickening with patchy alveolar airspace opacities concerning for multilobar pneumonia most severe in the right upper and right lower  lobe. Electronically Signed   By: Kathreen Devoid   On: 03/06/2020 11:47   DG Chest Port 1 View  Result Date: 03/06/2020 CLINICAL DATA:  Respiratory failure.  Stroke. EXAM: PORTABLE CHEST 1 VIEW COMPARISON:  One-view chest x-ray 03/04/2020 FINDINGS: The heart is enlarged. Endotracheal tube is stable. NG tube courses off the inferior border the film. Interstitial and  airspace disease is again noted on the right. Aeration is slightly improved. Minimal airspace opacities are noted at the left base. IMPRESSION: 1. Stable interstitial and airspace disease on the right. 2. Minimal airspace disease at the left base likely reflects atelectasis. Electronically Signed   By: San Morelle M.D.   On: 03/06/2020 08:23   DG Chest Port 1 View  Result Date: 03/08/2020 CLINICAL DATA:  Hypoxia EXAM: PORTABLE CHEST 1 VIEW COMPARISON:  Chest radiograph dated 01/21/2019 FINDINGS: The heart is borderline enlarged accounting for technique. Moderate to severe diffuse bilateral interstitial and airspace opacities are noted. There is no pleural effusion or pneumothorax. Degenerative changes are seen in the right shoulder and spine. IMPRESSION: Moderate to severe diffuse bilateral interstitial and airspace opacities, likely representing pulmonary edema versus multifocal pneumonia. Electronically Signed   By: Zerita Boers M.D.   On: 02/28/2020 15:59    ASSESSMENT / PLAN:  Acute on chronic hypoxic respiratory failure secondary to aspiration pneumonia  Mechanical Intubation  Hx: Chronic Bronchitis Full vent support for now-vent settings reviewed and established SBT once all parameters met VAP bundle implemented  IV steroids  Prn bronchodilator therapy   Chronic atrial fibrillation  Hx: Dysrhythmias, HTN, and CVA  Continuous telemetry monitoring  Continue outpatient aspirin and atorvastatin  Hold outpatient cardizem due to soft bp's  Prn metoprolol for heart rate management Continue maintenance fluids   Leukocytosis secondary to aspiration pneumonia, RLE wound infection,  and UTI  Trend WBC and monitor fever curve  Trend PCT and lactic acid  Follow cultures  Continue vancomycin and zosyn for now  Wound care consulted appreciate input   Hyperglycemia  CBG's  SSI  Hemoglobin A1c pending    Acute encephalopathy likely secondary to hypoxia and infectious process    Mechanical intubation pain/discomfort  Maintain RASS goal -1 to -2 while intubated  Continue propofol gtt and prn fentanyl for pain management  WUA daily   Best Practice: VTE px: subq heparin  SUP px: iv protonix  Diet: keep NPO for now if pt remains intubated in the next 24hrs will start TF's  Critical care provider statement:    Critical care time (minutes):  34   Critical care time was exclusive of:  Separately billable procedures and  treating other patients   Critical care was necessary to treat or prevent imminent or  life-threatening deterioration of the following conditions:  acute hypoxemic respiratory failure, encephalopathy, aspiration pneumonia, RLE infected wound, multiple comorbid conditions.    Critical care was time spent personally by me on the following  activities:  Development of treatment plan with patient or surrogate,  discussions with consultants, evaluation of patient's response to  treatment, examination of patient, obtaining history from patient or  surrogate, ordering and performing treatments and interventions, ordering  and review of laboratory studies and re-evaluation of patient's condition   I assumed direction of critical care for this patient from another  provider in my specialty: no

## 2020-03-06 NOTE — ED Provider Notes (Signed)
.  Critical Care Performed by: Duffy Bruce, MD Authorized by: Duffy Bruce, MD   Critical care provider statement:    Critical care time (minutes):  35   Critical care time was exclusive of:  Separately billable procedures and treating other patients and teaching time   Critical care was necessary to treat or prevent imminent or life-threatening deterioration of the following conditions:  Cardiac failure, circulatory failure and respiratory failure   Critical care was time spent personally by me on the following activities:  Development of treatment plan with patient or surrogate, discussions with consultants, evaluation of patient's response to treatment, examination of patient, obtaining history from patient or surrogate, ordering and performing treatments and interventions, ordering and review of laboratory studies, ordering and review of radiographic studies, pulse oximetry, re-evaluation of patient's condition and review of old charts   I assumed direction of critical care for this patient from another provider in my specialty: no   Procedure Name: Intubation Date/Time: 03/06/2020 2:30 AM Performed by: Duffy Bruce, MD Pre-anesthesia Checklist: Patient identified, Patient being monitored, Emergency Drugs available, Timeout performed and Suction available Oxygen Delivery Method: Non-rebreather mask Preoxygenation: Pre-oxygenation with 100% oxygen Induction Type: Rapid sequence Ventilation: Mask ventilation without difficulty Laryngoscope Size: Mac and 3 Grade View: Grade I Tube size: 7.5 mm Number of attempts: 1 Airway Equipment and Method: Video-laryngoscopy and Rigid stylet Placement Confirmation: ETT inserted through vocal cords under direct vision,  CO2 detector and Breath sounds checked- equal and bilateral Secured at: 23 cm Dental Injury: Teeth and Oropharynx as per pre-operative assessment  Difficulty Due To: Difficulty was unanticipated Future Recommendations: Recommend-  induction with short-acting agent, and alternative techniques readily available    OG placement  Date/Time: 03/06/2020 2:31 AM Performed by: Duffy Bruce, MD Authorized by: Duffy Bruce, MD  Preparation: Patient was prepped and draped in the usual sterile fashion. Local anesthesia used: no  Anesthesia: Local anesthesia used: no  Sedation: Patient sedated: yes  Patient tolerance: patient tolerated the procedure well with no immediate complications       Duffy Bruce, MD 03/06/20 0231

## 2020-03-06 NOTE — Consult Note (Signed)
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  Wound type: infectious surgical/on vancomycin currently.  Will implement topical care.  Pressure Injury POA: NA Measurement:14 cm x 4 cm x 0.5 cm dorsal foot and metatarsal amputation site.   Wound KRC:VKFMMC and purulence  Drainage (amount, consistency, odor) moderate  Periwound:edema to bilateral lower legs.  Dressing procedure/placement/frequency:Cleanse right foot wound with NS and pat dry.  Apply aquacel to wound bed. Cover with dry gauze and kerlix/tape. Change Mon/Wed/Fri.   Will not follow at this time.  Please re-consult if needed.  Maple Hudson MSN, RN, FNP-BC CWON Wound, Ostomy, Continence Nurse Pager [phone]

## 2020-03-06 NOTE — Consult Note (Signed)
PHARMACY CONSULT NOTE - FOLLOW UP  Pharmacy Consult for Electrolyte Monitoring and Replacement   Recent Labs: Potassium (mmol/L)  Date Value  03/06/2020 3.4 (L)   Magnesium (mg/dL)  Date Value  11/25/8116 2.0   Calcium (mg/dL)  Date Value  86/77/3736 8.4 (L)   Albumin (g/dL)  Date Value  68/15/9470 3.6   Phosphorus (mg/dL)  Date Value  76/15/1834 3.4   Sodium (mmol/L)  Date Value  03/06/2020 142     Assessment: 79 yo female with AMS/sepsis.    Pharmacy has been consulted for CCM monitoring and electrolyte management.  Goal of Therapy:  Electrolytes wnl's  Plan:  Potassium 40 mEq per tube once. Will follow up with morning labs.   Pricilla Riffle ,PharmD Clinical Pharmacist 03/06/2020 3:36 PM

## 2020-03-07 ENCOUNTER — Inpatient Hospital Stay: Payer: Medicare Other

## 2020-03-07 LAB — BASIC METABOLIC PANEL
Anion gap: 14 (ref 5–15)
BUN: 24 mg/dL — ABNORMAL HIGH (ref 8–23)
CO2: 24 mmol/L (ref 22–32)
Calcium: 8.8 mg/dL — ABNORMAL LOW (ref 8.9–10.3)
Chloride: 102 mmol/L (ref 98–111)
Creatinine, Ser: 1.15 mg/dL — ABNORMAL HIGH (ref 0.44–1.00)
GFR calc Af Amer: 53 mL/min — ABNORMAL LOW (ref >60)
GFR calc non Af Amer: 46 mL/min — ABNORMAL LOW (ref >60)
Glucose, Bld: 134 mg/dL — ABNORMAL HIGH (ref 70–99)
Potassium: 4.2 mmol/L (ref 3.5–5.1)
Sodium: 140 mmol/L (ref 135–145)

## 2020-03-07 LAB — GLUCOSE, CAPILLARY
Glucose-Capillary: 120 mg/dL — ABNORMAL HIGH (ref 70–99)
Glucose-Capillary: 122 mg/dL — ABNORMAL HIGH (ref 70–99)
Glucose-Capillary: 124 mg/dL — ABNORMAL HIGH (ref 70–99)
Glucose-Capillary: 135 mg/dL — ABNORMAL HIGH (ref 70–99)
Glucose-Capillary: 138 mg/dL — ABNORMAL HIGH (ref 70–99)
Glucose-Capillary: 141 mg/dL — ABNORMAL HIGH (ref 70–99)

## 2020-03-07 LAB — OCCULT BLOOD GASTRIC / DUODENUM (SPECIMEN CUP)
Occult Blood, Gastric: POSITIVE — AB
Occult Blood, Gastric: POSITIVE — AB
pH, Gastric: 2
pH, Gastric: 2

## 2020-03-07 MED ORDER — LACTATED RINGERS IV SOLN: INTRAVENOUS | Status: DC

## 2020-03-07 MED ORDER — PANTOPRAZOLE SODIUM 40 MG IV SOLR
40.0000 mg | Freq: Two times a day (BID) | INTRAVENOUS | Status: DC
  Administered 2020-03-07 (×2): 40 mg via INTRAVENOUS
  Filled 2020-03-07 (×2): qty 40

## 2020-03-07 NOTE — Progress Notes (Signed)
°   03/07/20 1214  Clinical Encounter Type  Visited With Patient  Visit Type Initial  Referral From Chaplain  Consult/Referral To Chaplain  Chaplain silently prayed outside patient's room.

## 2020-03-07 NOTE — Progress Notes (Signed)
Pt transported to CY from CCU and back while on the vent.

## 2020-03-07 NOTE — Progress Notes (Signed)
Name: Krystal Herrera MRN: [id number] DOB: [birth year]    ADMISSION DATE:  03/04/2020 CONSULTATION DATE: 03/03/2020  REFERRING MD : Dr. Vanessa [place]  CHIEF COMPLAINT: Altered Mental Status   BRIEF PATIENT DESCRIPTION: 79 yo female admitted with right transmetatarsal amputation wound infection, acute encephalopathy, and acute on chronic hypoxic respiratory failure secondary to aspiration pneumonia requiring mechanical intubation   SIGNIFICANT EVENTS/STUDIES:   06/20: Pt admitted to ICU mechanically intubated  6/21- patient weaned down on ventilator settings with plan to attmempt SBT with goal to liberate from MV post trial.  patient with AFrVR started on amio with rate control, urine OP improved. 6/22- despite zero sedation patient is unable to follow any verbal communications or meaningful response to stimulation.  CTH was done stat which shows large left sided CVA. I spoke to husband he states Sunday while patient was in wheelchair she vomited and then took a big breath with aspiration. He states she had some weakness on right side.  He states after this episode of vomiting her entire right side was paralyzed and he suspected she may have had CVA. Additionally husband states she stopped eliquis on her own despite being asked to continue this for her AF, subsequently she also stopped remainder of her medications. Husband states she is to be DNR/DNI.  HISTORY OF PRESENT ILLNESS:   This is a 79 yo female with a PMH of CVA, HTN, Atrial Fibrillation with RVR, Right Femoral Artery Occlusion, Dysrhythmia, Chronic Bronchitis, Blindness, Arthritis, and Anxiety.  She presented to [facility] ER on 06/20 via EMS from home with altered mental status following a vomiting episode. Per ER notes her baseline mentation was on 06/20 at 1300. Her husband also reported the pt has been noncompliant with her medications for the past 6 months or so.  She did have a transmetatarsal amputation, which was complicated by wound  dehiscence requiring debridement.  EMS reported upon their arrival at pts home she was tachycardic, tachypneic, and hypoxic on her chronic home O2 requiring NRB with O2 sats increasing to 96%.  In the ER pts O2 sats were 85% on 10L via NRB, therefore pt transitioned to Bipap.  However, she became agitated pulling Bipap mask off resulting in severe hypoxia with O2 sats in the 50's requiring mechanical intubation.  Lab results revealed glucose 194, calcium 8.7, BNP 816.6, lactic acid 2.9, wbc 13.6, pct <0.10, and UA positive for UTI.  COVID-19 negative, however CXR concerning for pulmonary edema vs. pneumonia.  She received ceftriaxone, vancomycin, and 1L NS bolus.  PCCM team contacted for ICU admission.      PAST MEDICAL HISTORY :   has a past medical history of Anxiety, Arthritis, Blind, Bronchitis, Complication of anesthesia (1985), Dysrhythmia (01/21/2019), Femoral artery occlusion, right ([place]), Hypertension, and Stroke ([place]).  has a past surgical history that includes Thyroid surgery; Thyroidectomy, partial; Tubal ligation; Lower Extremity Angiography (Right, 01/22/2019); Transmetatarsal amputation (Right, 02/02/2019); and Wound debridement (Right, 03/04/2019). Prior to Admission medications   Medication Sig Start Date End Date Taking? Authorizing Provider  aspirin EC 81 MG tablet Take 81 mg by mouth daily.    [provider]  atorvastatin (LIPITOR) 10 MG tablet Take 1 tablet (10 mg total) by mouth daily at 6 PM. 01/23/19   Salary, Holly Bodily D, MD  diltiazem (CARDIZEM) 30 MG tablet Take 30 mg by mouth 2 (two) times daily. 10/21/19   [provider]   Allergies  Allergen Reactions  . Hydrocodone-Acetaminophen Swelling    Itching to arms and swelling  to face and lips, improved with benadryl. HUSBAND STATES ALL NARCOTICS AFFECT PT THIS WAY    FAMILY HISTORY:  family history includes Heart failure in her father; Pneumonia in her father. SOCIAL HISTORY:  reports that she has been  smoking cigarettes. She has a 40.00 pack-year smoking history. She has never used smokeless tobacco. She reports current alcohol use of about 1.0 standard drink of alcohol per week. She reports that she does not use drugs.  REVIEW OF SYSTEMS:   Unable to assess pt intubated   SUBJECTIVE:  Unable to assess pt intubated   VITAL SIGNS: Temp:  [97.3 F (36.3 C)-97.9 F (36.6 C)] 97.7 F (36.5 C) (06/22 0715) Pulse Rate:  [44-125] 78 (06/22 0715) Resp:  [16-22] 19 (06/22 0715) BP: (77-128)/(64-85) 124/76 (06/22 0700) SpO2:  [93 %-100 %] 100 % (06/22 0715) FiO2 (%):  [24 %-30 %] 28 % (06/22 0754) Weight:  [78.8 kg] 78.8 kg (06/22 0500)  PHYSICAL EXAMINATION: General: acutely ill appearing female, NAD mechanically intubated Neuro: GCS4T HEENT: supple, no JVD  Cardiovascular: irregular irregular, no R/G, 2+ bilateral lower extremity edema  Lungs: diffuse rhonchi throughout, even, non labored  Abdomen: +BS x4, obese, soft, non distended  Musculoskeletal: right transmetatarsal amputation  Skin: right transmetatarsal wound with moderate amount of pus draining from incision site   Recent Labs  Lab 03/02/2020 1541 03/06/20 0504 03/07/20 0415  NA 143 142 140  K 3.7 3.4* 4.2  CL 105 107 102  CO2 '26 23 24  '$ BUN 17 19 24*  CREATININE 0.88 0.99 1.15*  GLUCOSE 194* 180* 134*   Recent Labs  Lab 02/28/2020 1541 03/06/20 0504  HGB 15.7* 16.2*  HCT 46.2* 47.1*  WBC 13.6* 19.2*  PLT 207 186   DG Chest 1 View  Result Date: 03/04/2020 CLINICAL DATA:  79 year old female status post intubation. EXAM: CHEST  1 VIEW; ABDOMEN - 1 VIEW COMPARISON:  Earlier radiograph dated 03/10/2020. FINDINGS: Endotracheal tube with tip approximately 3 cm above the carina. Enteric tube extends below the diaphragm with tip in the body of the stomach. Diffuse interstitial prominence with Kerley B-lines consistent with edema. Interval worsening of the airspace opacity involving the right mid to lower lung field which  may represent worsening edema although pneumonia is not excluded. No large pleural effusion or pneumothorax. Mild cardiomegaly. Atherosclerotic calcification of the aorta. There is gaseous distention of the stomach. Degenerative changes of the spine. No acute osseous pathology. IMPRESSION: 1. Endotracheal tube above the carina. Enteric tube extends below the diaphragm with tip in the body of the stomach. 2. Interval worsening of airspace density in the right lung. Electronically Signed   By: Anner Crete M.D.   On: 03/10/2020 18:06   DG Abdomen 1 View  Result Date: 03/03/2020 CLINICAL DATA:  79 year old female status post intubation. EXAM: CHEST  1 VIEW; ABDOMEN - 1 VIEW COMPARISON:  Earlier radiograph dated 03/14/2020. FINDINGS: Endotracheal tube with tip approximately 3 cm above the carina. Enteric tube extends below the diaphragm with tip in the body of the stomach. Diffuse interstitial prominence with Kerley B-lines consistent with edema. Interval worsening of the airspace opacity involving the right mid to lower lung field which may represent worsening edema although pneumonia is not excluded. No large pleural effusion or pneumothorax. Mild cardiomegaly. Atherosclerotic calcification of the aorta. There is gaseous distention of the stomach. Degenerative changes of the spine. No acute osseous pathology. IMPRESSION: 1. Endotracheal tube above the carina. Enteric tube extends below the diaphragm with tip  in the body of the stomach. 2. Interval worsening of airspace density in the right lung. Electronically Signed   By: Anner Crete M.D.   On: 02/16/2020 18:06   DG Chest Port 1 View  Result Date: 03/06/2020 CLINICAL DATA:  Chronic bronchitis, sepsis EXAM: PORTABLE CHEST 1 VIEW COMPARISON:  03/06/2020 FINDINGS: Endotracheal tube with the tip 4.2 cm above the carina. Nasogastric tube coursing below the diaphragm. Diffuse bilateral interstitial thickening with patchy alveolar airspace opacities most  severe in the right upper lobe and right lower lobe. Likely trace right pleural effusion. No pneumothorax. Stable cardiomediastinal silhouette. No aggressive osseous lesion. IMPRESSION: 1. Support lines and tubing in satisfactory position. 2. Bilateral interstitial thickening with patchy alveolar airspace opacities concerning for multilobar pneumonia most severe in the right upper and right lower lobe. Electronically Signed   By: Kathreen Devoid   On: 03/06/2020 11:47   DG Chest Port 1 View  Result Date: 03/06/2020 CLINICAL DATA:  Respiratory failure.  Stroke. EXAM: PORTABLE CHEST 1 VIEW COMPARISON:  One-view chest x-ray 03/04/2020 FINDINGS: The heart is enlarged. Endotracheal tube is stable. NG tube courses off the inferior border the film. Interstitial and airspace disease is again noted on the right. Aeration is slightly improved. Minimal airspace opacities are noted at the left base. IMPRESSION: 1. Stable interstitial and airspace disease on the right. 2. Minimal airspace disease at the left base likely reflects atelectasis. Electronically Signed   By: San Morelle M.D.   On: 03/06/2020 08:23   DG Chest Port 1 View  Result Date: 03/08/2020 CLINICAL DATA:  Hypoxia EXAM: PORTABLE CHEST 1 VIEW COMPARISON:  Chest radiograph dated 01/21/2019 FINDINGS: The heart is borderline enlarged accounting for technique. Moderate to severe diffuse bilateral interstitial and airspace opacities are noted. There is no pleural effusion or pneumothorax. Degenerative changes are seen in the right shoulder and spine. IMPRESSION: Moderate to severe diffuse bilateral interstitial and airspace opacities, likely representing pulmonary edema versus multifocal pneumonia. Electronically Signed   By: Zerita Boers M.D.   On: 03/02/2020 15:59       ASSESSMENT / PLAN:   Large territory left sided acute CVA  - discussed with family  - husband understands and wishes to proceed with comfort measures.  He states patient was  wheelchair bound from previous stroke and would not wish to have any further artificial life support.   - patient is now DNR/DNI  Acute on chronic hypoxic respiratory failure secondary to aspiration pneumonia  Mechanical Intubation  Hx: Chronic Bronchitis Full vent support for now-vent settings reviewed and established SBT once all parameters met VAP bundle implemented  IV steroids  Prn bronchodilator therapy   Chronic atrial fibrillation  Hx: Dysrhythmias, HTN, and CVA  Continuous telemetry monitoring  Continue outpatient aspirin and atorvastatin  Hold outpatient cardizem due to soft bp's  Prn metoprolol for heart rate management Continue maintenance fluids   Leukocytosis secondary to aspiration pneumonia, RLE wound infection,  and UTI  Trend WBC and monitor fever curve  Trend PCT and lactic acid  Follow cultures  Continue vancomycin and zosyn for now  Wound care consulted appreciate input   Hyperglycemia  CBG's  SSI  Hemoglobin A1c pending    Acute encephalopathy likely secondary to hypoxia and infectious process   Mechanical intubation pain/discomfort  Maintain RASS goal -1 to -2 while intubated  Continue propofol gtt and prn fentanyl for pain management  WUA daily   Best Practice: VTE px: subq heparin  SUP px: iv protonix  Diet: keep NPO for now if pt remains intubated in the next 24hrs will start TF's  Critical care provider statement:    Critical care time (minutes):  109   Critical care time was exclusive of:  Separately billable procedures and  treating other patients   Critical care was necessary to treat or prevent imminent or  life-threatening deterioration of the following conditions:  acute hypoxemic respiratory failure, encephalopathy, aspiration pneumonia, RLE infected wound, multiple comorbid conditions.    Critical care was time spent personally by me on the following  activities:  Development of treatment plan with patient or surrogate,    discussions with consultants, evaluation of patient's response to  treatment, examination of patient, obtaining history from patient or  surrogate, ordering and performing treatments and interventions, ordering  and review of laboratory studies and re-evaluation of patient's condition   I assumed direction of critical care for this patient from another  provider in my specialty: no

## 2020-03-07 NOTE — TOC Progression Note (Signed)
Transition of Care [facility]) - Progression Note    Patient Details  Name: Krystal Herrera MRN: [id number] Date of Birth: 14-Dec-1940  [facility] CM/SW Contact  Marina Goodell Phone Number: [phone] 03/07/2020, 12:09 PM  Clinical Narrative:     CSW called patient's spouse West Bali [phone], about advanced directive paperwork or POA paper work, there is not a Engineer, technical sales or answering machine set up, unable to leave message for call back request.      Expected Discharge Plan and Services                                                 Social Determinants of Health (SDOH) Interventions    Readmission Risk Interventions No flowsheet data found.

## 2020-03-07 NOTE — Progress Notes (Signed)
Initial Nutrition Assessment  DOCUMENTATION CODES:   Not applicable  INTERVENTION:  If patient remains intubated recommend initiating tube feeds when appropriate per MD in setting of work-up for possible GI bleed.  Once patient is appropriate for enteral nutrition recommend initiating Vital AF 1.2 Cal at 15 mL/hr and advance by 20 mL/hr every 8 hours to goal rate of 55 mL/hr (1320 mL goal daily volume). Provides 1584 kcal, 99 grams of protein, 1069 mL H2O daily.  NUTRITION DIAGNOSIS:   Inadequate oral intake related to inability to eat as evidenced by NPO status.  GOAL:   Patient will meet greater than or equal to 90% of their needs  MONITOR:   Vent status, Labs, Weight trends, TF tolerance, I & O's  REASON FOR ASSESSMENT:   Ventilator    ASSESSMENT:   79 year old female with PMHx of anxiety, arthritis, hx CVA, HTN admitted with right transmetatarsal amputation wound infection, acute encephalopathy, acute on chronic hypoxic respiratory failure secondary to aspiration PNA requiring mechanical intubation on 6/20, also with large left MCA territory acute infarct.   Patient is currently intubated on ventilator support MV: 10.7 L/min Temp (24hrs), Avg:97.9 F (36.6 C), Min:97.5 F (36.4 C), Max:99.1 F (37.3 C)  Propofol: N/A - propofol has been off since 0800  Medications reviewed and include: Novolog 2-6 units Q4hrs, Protonix, amiodarone, propofol gtt.  Labs reviewed: CBG 120-138, BUN 24, Creatinine 1.15.  Enteral Access: 16 Fr. OGT; terminates in stomach per abdominal x-ray 6/20  I/O: 600 mL UOP yesterday (0.3 mL/kg/hr)  Weight trend: 78.8 kg on 6/22; + 2.3 kg from 6/20  Patient does not meet criteria for malnutrition at this time.  Discussed with RN and on rounds. Plan for occult blood test of gastric contents today due to concern for possible bleeding. Plan was for possible extubation today but that was prior to findings on CT head this afternoon.  NUTRITION -  FOCUSED PHYSICAL EXAM:    Most Recent Value  Orbital Region No depletion  Upper Arm Region No depletion  Thoracic and Lumbar Region No depletion  Buccal Region Unable to assess  Temple Region No depletion  Clavicle Bone Region No depletion  Clavicle and Acromion Bone Region No depletion  Scapular Bone Region Unable to assess  Dorsal Hand No depletion  Patellar Region No depletion  Anterior Thigh Region No depletion  Posterior Calf Region No depletion  Edema (RD Assessment) Mild  Hair Reviewed  Eyes Unable to assess  Mouth Unable to assess  Skin Reviewed  Nails Reviewed     Diet Order:   Diet Order            Diet NPO time specified  Diet effective now                EDUCATION NEEDS:   No education needs have been identified at this time  Skin:  Skin Assessment: Reviewed RN Assessment  Last BM:  PTA  Height:   Ht Readings from Last 1 Encounters:  03/07/20 5' 7.99" (1.727 m)   Weight:   Wt Readings from Last 1 Encounters:  03/07/20 78.8 kg   BMI:  Body mass index is 26.42 kg/m.  Estimated Nutritional Needs:   Kcal:  1618 (PSU 2003b w/ MSJ 1322, Ve 10.7, Tmax 37.3)  Protein:  90-100 grams  Fluid:  1.7 L/day  Felix Pacini, MS, RD, LDN Pager number available on Amion

## 2020-03-08 LAB — CBC WITH DIFFERENTIAL/PLATELET
Abs Immature Granulocytes: 0.19 10*3/uL — ABNORMAL HIGH (ref 0.00–0.07)
Basophils Absolute: 0 10*3/uL (ref 0.0–0.1)
Basophils Relative: 0 %
Eosinophils Absolute: 0 10*3/uL (ref 0.0–0.5)
Eosinophils Relative: 0 %
HCT: 41.7 % (ref 36.0–46.0)
Hemoglobin: 14.2 g/dL (ref 12.0–15.0)
Immature Granulocytes: 1 %
Lymphocytes Relative: 4 %
Lymphs Abs: 0.7 10*3/uL (ref 0.7–4.0)
MCH: 34 pg (ref 26.0–34.0)
MCHC: 34.1 g/dL (ref 30.0–36.0)
MCV: 99.8 fL (ref 80.0–100.0)
Monocytes Absolute: 1.7 10*3/uL — ABNORMAL HIGH (ref 0.1–1.0)
Monocytes Relative: 9 %
Neutro Abs: 15.6 10*3/uL — ABNORMAL HIGH (ref 1.7–7.7)
Neutrophils Relative %: 86 %
Platelets: 162 10*3/uL (ref 150–400)
RBC: 4.18 MIL/uL (ref 3.87–5.11)
RDW: 14.6 % (ref 11.5–15.5)
WBC: 18.1 10*3/uL — ABNORMAL HIGH (ref 4.0–10.5)
nRBC: 0 % (ref 0.0–0.2)

## 2020-03-08 LAB — BASIC METABOLIC PANEL
Anion gap: 9 (ref 5–15)
BUN: 29 mg/dL — ABNORMAL HIGH (ref 8–23)
CO2: 26 mmol/L (ref 22–32)
Calcium: 8.6 mg/dL — ABNORMAL LOW (ref 8.9–10.3)
Chloride: 103 mmol/L (ref 98–111)
Creatinine, Ser: 1.03 mg/dL — ABNORMAL HIGH (ref 0.44–1.00)
GFR calc Af Amer: >60 mL/min (ref >60)
GFR calc non Af Amer: 52 mL/min — ABNORMAL LOW (ref >60)
Glucose, Bld: 134 mg/dL — ABNORMAL HIGH (ref 70–99)
Potassium: 3.4 mmol/L — ABNORMAL LOW (ref 3.5–5.1)
Sodium: 138 mmol/L (ref 135–145)

## 2020-03-08 LAB — GLUCOSE, CAPILLARY
Glucose-Capillary: 135 mg/dL — ABNORMAL HIGH (ref 70–99)
Glucose-Capillary: 131 mg/dL — ABNORMAL HIGH (ref 70–99)

## 2020-03-08 LAB — URINE CULTURE
Culture: 10000 — AB
Special Requests: NORMAL

## 2020-03-08 LAB — MAGNESIUM: Magnesium: 1.7 mg/dL (ref 1.7–2.4)

## 2020-03-08 LAB — PHOSPHORUS: Phosphorus: 2.8 mg/dL (ref 2.5–4.6)

## 2020-03-08 LAB — TRIGLYCERIDES: Triglycerides: 101 mg/dL (ref <150)

## 2020-03-08 MED ORDER — MORPHINE 100MG IN NS 100ML (1MG/ML) PREMIX INFUSION
1.0000 mg/h | INTRAVENOUS | Status: DC
  Administered 2020-03-08: 1 mg/h via INTRAVENOUS
  Administered 2020-03-09: 6 mg/h via INTRAVENOUS
  Filled 2020-03-08 (×2): qty 100

## 2020-03-08 MED ORDER — LORAZEPAM 2 MG/ML IJ SOLN: 1.0000 mg | INTRAMUSCULAR | Status: DC | PRN

## 2020-03-08 MED ORDER — ONDANSETRON HCL 4 MG/2ML IJ SOLN: 4.0000 mg | Freq: Four times a day (QID) | INTRAMUSCULAR | Status: DC | PRN

## 2020-03-08 MED ORDER — GLYCOPYRROLATE 0.2 MG/ML IJ SOLN
0.2000 mg | INTRAMUSCULAR | Status: DC | PRN
  Filled 2020-03-08: qty 1

## 2020-03-08 NOTE — Progress Notes (Signed)
I spoke with patient's husband and confirmed that he did not want to be here for terminal extubation.  E-link chat offered prior to extubation and husband declined.  Husband stated that he wanted to remember the good times.

## 2020-03-08 NOTE — Progress Notes (Signed)
Patient continues to rest comfortably in bed.  Does not appear to be in distress at current time. Morphine drip was increased to 5mg /hr earlier this shift for labored breathing.

## 2020-03-08 NOTE — Progress Notes (Signed)
Patient extubated to 2L nasal cannula per Dr. order. No issues with extubation.

## 2020-03-08 NOTE — Progress Notes (Signed)
Patient resting comfortably on morphine drip at 2mg /hr. Will continue to monitor.

## 2020-03-08 NOTE — Progress Notes (Signed)
Name: Krystal Herrera MRN: [id number] DOB: 06-Mar-1941    ADMISSION DATE:  03/06/2020 CONSULTATION DATE: 03/02/2020  REFERRING MD : Dr. Vanessa   CHIEF COMPLAINT: Altered Mental Status   BRIEF PATIENT DESCRIPTION: 79 yo female admitted with right transmetatarsal amputation wound infection, acute encephalopathy, and acute on chronic hypoxic respiratory failure secondary to aspiration pneumonia requiring mechanical intubation   SIGNIFICANT EVENTS/STUDIES:   06/20: Pt admitted to ICU mechanically intubated  6/21- patient weaned down on ventilator settings with plan to attmempt SBT with goal to liberate from MV post trial.  patient with AFrVR started on amio with rate control, urine OP improved. 6/22- despite zero sedation patient is unable to follow any verbal communications or meaningful response to stimulation.  CTH was done stat which shows large left sided CVA. I spoke to husband he states Sunday while patient was in wheelchair she vomited and then took a big breath with aspiration. He states she had some weakness on right side.  He states after this episode of vomiting her entire right side was paralyzed and he suspected she may have had CVA. Additionally husband states she stopped eliquis on her own despite being asked to continue this for her AF, subsequently she also stopped remainder of her medications. Husband states she is to be DNR/DNI. 6/23 - patient is made comfort care and we are asked by husband to liberate patient off mechanical ventilation to allow her to pass away without artificial life support. Will initiate comfort care.   HISTORY OF PRESENT ILLNESS:   This is a 79 yo female with a PMH of CVA, HTN, Atrial Fibrillation with RVR, Right Femoral Artery Occlusion, Dysrhythmia, Chronic Bronchitis, Blindness, Arthritis, and Anxiety.  She presented to [facility] ER on 06/20 via EMS from home with altered mental status following a vomiting episode. Per ER notes her baseline mentation was on  06/20 at 1300. Her husband also reported the pt has been noncompliant with her medications for the past 6 months or so.  She did have a transmetatarsal amputation, which was complicated by wound dehiscence requiring debridement.  EMS reported upon their arrival at pts home she was tachycardic, tachypneic, and hypoxic on her chronic home O2 requiring NRB with O2 sats increasing to 96%.  In the ER pts O2 sats were 85% on 10L via NRB, therefore pt transitioned to Bipap.  However, she became agitated pulling Bipap mask off resulting in severe hypoxia with O2 sats in the 50's requiring mechanical intubation.  Lab results revealed glucose 194, calcium 8.7, BNP 816.6, lactic acid 2.9, wbc 13.6, pct <0.10, and UA positive for UTI.  COVID-19 negative, however CXR concerning for pulmonary edema vs. pneumonia.  She received ceftriaxone, vancomycin, and 1L NS bolus.  PCCM team contacted for ICU admission.      PAST MEDICAL HISTORY :   has a past medical history of Anxiety, Arthritis, Blind, Bronchitis, Complication of anesthesia (1985), Dysrhythmia (01/21/2019), Femoral artery occlusion, right ([place]), Hypertension, and Stroke ([place]).  has a past surgical history that includes Thyroid surgery; Thyroidectomy, partial; Tubal ligation; Lower Extremity Angiography (Right, 01/22/2019); Transmetatarsal amputation (Right, 02/02/2019); and Wound debridement (Right, 03/04/2019). Prior to Admission medications   Medication Sig Start Date End Date Taking? Authorizing Provider  aspirin EC 81 MG tablet Take 81 mg by mouth daily.    [provider]  atorvastatin (LIPITOR) 10 MG tablet Take 1 tablet (10 mg total) by mouth daily at 6 PM. 01/23/19   Salary, Avel Peace, MD  diltiazem (CARDIZEM) 30 MG  tablet Take 30 mg by mouth 2 (two) times daily. 10/21/19   [provider]   Allergies  Allergen Reactions   Hydrocodone-Acetaminophen Swelling    Itching to arms and swelling to face and lips, improved with benadryl.  HUSBAND STATES ALL NARCOTICS AFFECT PT THIS WAY    FAMILY HISTORY:  family history includes Heart failure in her father; Pneumonia in her father. SOCIAL HISTORY:  reports that she has been smoking cigarettes. She has a 40.00 pack-year smoking history. She has never used smokeless tobacco. She reports current alcohol use of about 1.0 standard drink of alcohol per week. She reports that she does not use drugs.  REVIEW OF SYSTEMS:   Unable to assess pt intubated   SUBJECTIVE:  Unable to assess pt intubated   VITAL SIGNS: Temp:  [97.7 F (36.5 C)-99.9 F (37.7 C)] 97.7 F (36.5 C) (06/23 0600) Pulse Rate:  [56-121] 56 (06/23 0600) Resp:  [18-25] 20 (06/23 0600) BP: (115-148)/(61-97) 130/96 (06/23 0600) SpO2:  [91 %-100 %] 97 % (06/23 0802) FiO2 (%):  [28 %] 28 % (06/23 0802) Weight:  [80.2 kg] 80.2 kg (06/23 0409)  PHYSICAL EXAMINATION: General: acutely ill appearing female, NAD mechanically intubated Neuro: GCS4T HEENT: supple, no JVD  Cardiovascular: irregular irregular, no R/G, 2+ bilateral lower extremity edema  Lungs: diffuse rhonchi throughout, even, non labored  Abdomen: +BS x4, obese, soft, non distended  Musculoskeletal: right transmetatarsal amputation  Skin: right transmetatarsal wound with moderate amount of pus draining from incision site   Recent Labs  Lab 03/06/20 0504 03/07/20 0415 03/08/20 0548  NA 142 140 138  K 3.4* 4.2 3.4*  CL 107 102 103  CO2 _0 BUN 19 24* 29*  CREATININE 0.99 1.15* 1.03*  GLUCOSE 180* 134* 134*   Recent Labs  Lab 03/07/2020 1541 03/06/20 0504 03/08/20 0548  HGB 15.7* 16.2* 14.2  HCT 46.2* 47.1* 41.7  WBC 13.6* 19.2* 18.1*  PLT 207 186 162   CT HEAD WO CONTRAST  Result Date: 03/07/2020 CLINICAL DATA:  Altered mental status. EXAM: CT HEAD WITHOUT CONTRAST TECHNIQUE: Contiguous axial images were obtained from the base of the skull through the vertex without intravenous contrast. COMPARISON:  None. FINDINGS: Brain:  Large low density is seen involving the left middle cerebral arterial territory consistent with acute infarction. This results in approximately 14 mm of left-to-right midline shift. Ventricular size is within normal limits. No definite hemorrhage is noted. No definite mass lesion is noted. Vascular: High density material is noted in the distal left intracranial internal carotid artery and left middle cerebral artery suggesting acute thrombosis. Skull: Normal. Negative for fracture or focal lesion. Sinuses/Orbits: No acute finding. Other: None. 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 High density material is noted in the distal left intracranial internal carotid artery and left middle cerebral artery suggesting acute thrombosis. These results will be called to the ordering clinician or representative by the Radiologist Assistant, and communication documented in the PACS or zVision Dashboard. Electronically Signed   By: Marijo Conception M.D.   On: 03/07/2020 13:38   DG Chest Port 1 View  Result Date: 03/06/2020 CLINICAL DATA:  Chronic bronchitis, sepsis EXAM: PORTABLE CHEST 1 VIEW COMPARISON:  03/06/2020 FINDINGS: Endotracheal tube with the tip 4.2 cm above the carina. Nasogastric tube coursing below the diaphragm. Diffuse bilateral interstitial thickening with patchy alveolar airspace opacities most severe in the right upper lobe and  right lower lobe. Likely trace right pleural effusion. No pneumothorax. Stable cardiomediastinal silhouette. No aggressive osseous lesion. IMPRESSION: 1. Support lines and tubing in satisfactory position. 2. Bilateral interstitial thickening with patchy alveolar airspace opacities concerning for multilobar pneumonia most severe in the right upper and right lower lobe. Electronically Signed   By: Kathreen Devoid   On: 03/06/2020 11:47       ASSESSMENT / PLAN:   Large territory  left sided acute CVA  - discussed with family  - husband understands and wishes to proceed with comfort measures.  He states patient was wheelchair bound from previous stroke and would not wish to have any further artificial life support.   - patient is now comfort care and plan is for compassionate extubation.   Acute on chronic hypoxic respiratory failure secondary to aspiration pneumonia  Mechanical Intubation  Hx: Chronic Bronchitis Full vent support for now-vent settings reviewed and established SBT once all parameters met VAP bundle implemented  IV steroids  Prn bronchodilator therapy   Chronic atrial fibrillation  Hx: Dysrhythmias, HTN, and CVA  Continuous telemetry monitoring  Continue outpatient aspirin and atorvastatin  Hold outpatient cardizem due to soft bp's  Prn metoprolol for heart rate management Continue maintenance fluids   Leukocytosis secondary to aspiration pneumonia, RLE wound infection,  and UTI  Trend WBC and monitor fever curve  Trend PCT and lactic acid  Follow cultures  Continue vancomycin and zosyn for now  Wound care consulted appreciate input   Hyperglycemia  CBG's  SSI  Hemoglobin A1c pending    Acute encephalopathy likely secondary to hypoxia and infectious process   Mechanical intubation pain/discomfort  Maintain RASS goal -1 to -2 while intubated  Continue propofol gtt and prn fentanyl for pain management  WUA daily   Best Practice: VTE px: subq heparin  SUP px: iv protonix  Diet: keep NPO for now if pt remains intubated in the next 24hrs will start TF's  Critical care provider statement:    Critical care time (minutes):  33   Critical care time was exclusive of:  Separately billable procedures and  treating other patients   Critical care was necessary to treat or prevent imminent or  life-threatening deterioration of the following conditions:  acute hypoxemic respiratory failure, encephalopathy, aspiration pneumonia, RLE infected  wound, multiple comorbid conditions.    Critical care was time spent personally by me on the following  activities:  Development of treatment plan with patient or surrogate,  discussions with consultants, evaluation of patient's response to  treatment, examination of patient, obtaining history from patient or  surrogate, ordering and performing treatments and interventions, ordering  and review of laboratory studies and re-evaluation of patient's condition   I assumed direction of critical care for this patient from another  provider in my specialty: no

## 2020-03-10 LAB — CULTURE, BLOOD (ROUTINE X 2)
Culture: NO GROWTH
Culture: NO GROWTH
Special Requests: ADEQUATE

## 2020-03-16 NOTE — Progress Notes (Signed)
  I spoke to husband this morning to update him on passing of wife. He has asked for [employer] in [place]. Krystal Herrera is thankful for care.     Vida Rigger, M.D.  Pulmonary & Critical Care Medicine  [facility]

## 2020-03-16 NOTE — Progress Notes (Signed)
Attempted to inform pts husband Phillips Climes the pt passed away at 05:46 am, however he did not answer the telephone.  Therefore, left voicemail message instructing Mr. Marlise Eves to return my phone call.   Sonda Rumble, AGNP  Pulmonary/Critical Care Pager [phone] (please enter 7 digits) PCCM Consult Pager [phone] (please enter 7 digits)

## 2020-03-16 NOTE — Death Summary Note (Signed)
DEATH SUMMARY   Patient Details  Name: Krystal Herrera MRN: [id number] DOB: Sep 08, 1941  Admission/Discharge Information   Admit Date:  Mar 16, 2020  Date of Death: Date of Death: March 20, 2020  Time of Death:  05:46 am   Length of Stay: 4  Referring Physician: Patient, No Pcp Per   Reason(s) for Hospitalization  Altered Mental Status  Diagnoses  Preliminary cause of death: Acute respiratory failure with hypoxia (HCC) Secondary Diagnoses (including complications and co-morbidities):  Active Problems:   Acute on chronic respiratory failure (HCC)   Right transmetatarsal wound infection   Aspiration pneumonia    Acute encephalopathy    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 year old female admitted to ICU on 03/16/20 with right transmetatarsal amputation infection, UTI, acute encephalopathy, and acute hypoxic respiratory failure requiring mechanical intubation.  On 03/07/2020 pt unable to follow simple commands while mechanically intubated, CT Head on 03/07/2020 revealed a large left middle cerebral arterial territory acute infarction with approximately 14 mm left to right midline shift. Pts husband reported she had been noncompliant with taking her eliquis along with the remainder of her medications as prescribed.  Following further discussions with ICU Intensivist regarding prognosis and pts quality of life prior to hospitalization pts husband [place] decided to transition pt to comfort measures only on 03/08/2020.  The pt expired on Mar 20, 2020 at 05:46 am.    Pertinent Labs and Studies  Significant Diagnostic Studies DG Chest 1 View  Result Date: 03/16/2020 CLINICAL DATA:  79 year old female status post intubation. EXAM: CHEST   1 VIEW; ABDOMEN - 1 VIEW COMPARISON:  Earlier radiograph dated 2020-03-16. FINDINGS: Endotracheal tube with tip approximately 3 cm above the carina. Enteric tube extends below the diaphragm with tip in the body of the stomach. Diffuse interstitial prominence with Kerley B-lines consistent with edema. Interval worsening of the airspace opacity involving the right mid to lower lung field which may represent worsening edema although pneumonia is not excluded. No large pleural effusion or pneumothorax. Mild cardiomegaly. Atherosclerotic calcification of the aorta. There is gaseous distention of the stomach. Degenerative changes of the spine. No acute osseous pathology. IMPRESSION: 1. Endotracheal tube above the carina. Enteric tube extends below the diaphragm with tip in the body of the stomach. 2. Interval worsening of airspace density in the right lung. Electronically Signed   By: Elgie Collard M.D.   On: 16-Mar-2020 18:06   DG Abdomen 1 View  Result Date: March 16, 2020 CLINICAL DATA:  79 year old female status post intubation. EXAM: CHEST  1 VIEW; ABDOMEN - 1 VIEW COMPARISON:  Earlier radiograph dated March 16, 2020. FINDINGS: Endotracheal tube with tip approximately 3 cm above the carina. Enteric tube extends below the diaphragm with tip in the body of the stomach. Diffuse interstitial prominence with Kerley B-lines consistent with edema. Interval worsening of the airspace opacity involving the right mid to lower lung field which may represent worsening edema although pneumonia is not excluded. No large pleural effusion or pneumothorax. Mild cardiomegaly. Atherosclerotic calcification of the aorta. There is gaseous distention of the stomach. Degenerative changes of the spine. No acute osseous pathology. IMPRESSION: 1. Endotracheal tube above the carina. Enteric tube extends below the diaphragm with tip in the body of the stomach. 2. Interval worsening of airspace density in the  right lung. Electronically Signed   By:  Elgie Collard M.D.   On: 04-02-2020 18:06   CT HEAD WO CONTRAST  Result Date: 03/07/2020 CLINICAL DATA:  Altered mental status. EXAM: CT HEAD WITHOUT CONTRAST TECHNIQUE: Contiguous axial images were obtained from the base of the skull through the vertex without intravenous contrast. COMPARISON:  None. FINDINGS: Brain: Large low density is seen involving the left middle cerebral arterial territory consistent with acute infarction. This results in approximately 14 mm of left-to-right midline shift. Ventricular size is within normal limits. No definite hemorrhage is noted. No definite mass lesion is noted. Vascular: High density material is noted in the distal left intracranial internal carotid artery and left middle cerebral artery suggesting acute thrombosis. Skull: Normal. Negative for fracture or focal lesion. Sinuses/Orbits: No acute finding. Other: None. 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 High density material is noted in the distal left intracranial internal carotid artery and left middle cerebral artery suggesting acute thrombosis. These results will be called to the ordering clinician or representative by the Radiologist Assistant, and communication documented in the PACS or zVision Dashboard. Electronically Signed   By: Lupita Raider M.D.   On: 03/07/2020 13:38   DG Chest Port 1 View  Result Date: 03/06/2020 CLINICAL DATA:  Chronic bronchitis, sepsis EXAM: PORTABLE CHEST 1 VIEW COMPARISON:  03/06/2020 FINDINGS: Endotracheal tube with the tip 4.2 cm above the carina. Nasogastric tube coursing below the diaphragm. Diffuse bilateral interstitial thickening with patchy alveolar airspace opacities most severe in the right upper lobe and right lower lobe. Likely trace right pleural effusion. No pneumothorax. Stable cardiomediastinal silhouette. No aggressive osseous lesion. IMPRESSION: 1. Support  lines and tubing in satisfactory position. 2. Bilateral interstitial thickening with patchy alveolar airspace opacities concerning for multilobar pneumonia most severe in the right upper and right lower lobe. Electronically Signed   By: Elige Ko   On: 03/06/2020 11:47   DG Chest Port 1 View  Result Date: 03/06/2020 CLINICAL DATA:  Respiratory failure.  Stroke. EXAM: PORTABLE CHEST 1 VIEW COMPARISON:  One-view chest x-ray 02-Apr-2020 FINDINGS: The heart is enlarged. Endotracheal tube is stable. NG tube courses off the inferior border the film. Interstitial and airspace disease is again noted on the right. Aeration is slightly improved. Minimal airspace opacities are noted at the left base. IMPRESSION: 1. Stable interstitial and airspace disease on the right. 2. Minimal airspace disease at the left base likely reflects atelectasis. Electronically Signed   By: Marin Roberts M.D.   On: 03/06/2020 08:23   DG Chest Port 1 View  Result Date: Apr 02, 2020 CLINICAL DATA:  Hypoxia EXAM: PORTABLE CHEST 1 VIEW COMPARISON:  Chest radiograph dated 01/21/2019 FINDINGS: The heart is borderline enlarged accounting for technique. Moderate to severe diffuse bilateral interstitial and airspace opacities are noted. There is no pleural effusion or pneumothorax. Degenerative changes are seen in the right shoulder and spine. IMPRESSION: Moderate to severe diffuse bilateral interstitial and airspace opacities, likely representing pulmonary edema versus multifocal pneumonia. Electronically Signed   By: Romona Curls M.D.   On: Apr 02, 2020 15:59    Microbiology Recent Results (from the past 240 hour(s))  Urine culture     Status: Abnormal   Collection Time: 04/02/20  3:41 PM   Specimen: In/Out Cath Urine  Result Value Ref Range Status   Specimen Description   Final    IN/OUT CATH URINE Performed at [facility], [address] [place], [place] [address]    Special Requests   Final    Normal Performed  at [facility] [place], [address] [place], [place] [address]    Culture (A)  Final    10,000 COLONIES/mL PROTEUS MIRABILIS 20,000 COLONIES/mL ENTEROBACTER CLOACAE    Report Status 03/08/2020 FINAL  Final   Organism ID, Bacteria PROTEUS MIRABILIS (A)  Final   Organism ID, Bacteria ENTEROBACTER CLOACAE (A)  Final      Susceptibility   Enterobacter cloacae - MIC*    CEFAZOLIN RESISTANT Resistant     CIPROFLOXACIN <=0.25 SENSITIVE Sensitive     GENTAMICIN <=1 SENSITIVE Sensitive     IMIPENEM <=0.25 SENSITIVE Sensitive     NITROFURANTOIN <=16 SENSITIVE Sensitive     TRIMETH/SULFA <=20 SENSITIVE Sensitive     PIP/TAZO <=4 SENSITIVE Sensitive     * 20,000 COLONIES/mL ENTEROBACTER CLOACAE   Proteus mirabilis - MIC*    AMPICILLIN <=2 SENSITIVE Sensitive     CEFAZOLIN 8 SENSITIVE Sensitive     CEFTRIAXONE <=0.25 SENSITIVE Sensitive     CIPROFLOXACIN <=0.25 SENSITIVE Sensitive     GENTAMICIN <=1 SENSITIVE Sensitive     IMIPENEM 2 SENSITIVE Sensitive     NITROFURANTOIN 128 RESISTANT Resistant     TRIMETH/SULFA <=20 SENSITIVE Sensitive     AMPICILLIN/SULBACTAM <=2 SENSITIVE Sensitive     PIP/TAZO <=4 SENSITIVE Sensitive     * 10,000 COLONIES/mL PROTEUS MIRABILIS  Blood Culture (routine x 2)     Status: None (Preliminary result)   Collection Time: 03/06/2020  3:42 PM   Specimen: BLOOD  Result Value Ref Range Status   Specimen Description BLOOD RIGHT ANTECUBITAL  Final   Special Requests   Final    BOTTLES DRAWN AEROBIC AND ANAEROBIC Blood Culture results may not be optimal due to an inadequate volume of blood received in culture bottles   Culture   Final    NO GROWTH 3 DAYS Performed at [facility], [address] [place], [place] [address]    Report Status PENDING  Incomplete  Blood Culture (routine x 2)     Status: None (Preliminary result)   Collection Time: 03/12/2020  3:42 PM   Specimen: BLOOD  Result Value Ref Range Status   Specimen Description BLOOD LEFT  ANTECUBITAL  Final   Special Requests   Final    BOTTLES DRAWN AEROBIC AND ANAEROBIC Blood Culture adequate volume   Culture   Final    NO GROWTH 3 DAYS Performed at [facility], [address] [place], [place] [address]    Report Status PENDING  Incomplete  SARS Coronavirus 2 by RT PCR (hospital order, performed in [place] hospital lab) Nasopharyngeal Nasopharyngeal Swab     Status: None   Collection Time: 02/19/2020  6:11 PM   Specimen: Nasopharyngeal Swab  Result Value Ref Range Status   SARS Coronavirus 2 NEGATIVE NEGATIVE Final    Comment: (NOTE) SARS-CoV-2 target nucleic acids are NOT DETECTED.  The SARS-CoV-2 RNA is generally detectable in upper and lower respiratory specimens during the acute phase of infection. The lowest concentration of SARS-CoV-2 viral copies this assay can detect is 250 copies / mL. A negative result does not preclude SARS-CoV-2 infection and should not be used as the sole basis for treatment or other patient management decisions.  A negative result may occur with improper specimen collection / handling, submission of specimen other than nasopharyngeal swab, presence of viral mutation(s) within the areas targeted by this assay, and  inadequate number of viral copies (<250 copies / mL). A negative result must be combined with clinical observations, patient history, and epidemiological information.  Fact Sheet for Patients:   BoilerBrush.com.cy  Fact Sheet for Healthcare Providers: https://pope.com/  This test is not yet approved or  cleared by the [place] FDA and has been authorized for detection and/or diagnosis of SARS-CoV-2 by FDA under an Emergency Use Authorization (EUA).  This EUA will remain in effect (meaning this test can be used) for the duration of the COVID-19 declaration under Section 564(b)(1) of the Act, 21 U.S.C. section 360bbb-3(b)(1), unless the authorization is  terminated or revoked sooner.  Performed at [facility], [address] [place], [place] [address]   MRSA PCR Screening     Status: None   Collection Time: Apr 02, 2020  9:55 PM   Specimen: Nasopharyngeal  Result Value Ref Range Status   MRSA by PCR NEGATIVE NEGATIVE Final    Comment:        The GeneXpert MRSA Assay (FDA approved for NASAL specimens only), is one component of a comprehensive MRSA colonization surveillance program. It is not intended to diagnose MRSA infection nor to guide or monitor treatment for MRSA infections. Performed at [facility], [address] [place], [place] [address]     Lab Basic Metabolic Panel: Recent Labs  Lab 04-02-20 1541 03/06/20 0504 03/07/20 0415 03/08/20 0548  NA 143 142 140 138  K 3.7 3.4* 4.2 3.4*  CL 105 107 102 103  CO2 26 23 24 26   GLUCOSE 194* 180* 134* 134*  BUN 17 19 24* 29*  CREATININE 0.88 0.99 1.15* 1.03*  CALCIUM 8.7* 8.4* 8.8* 8.6*  MG 1.7 2.0  --  1.7  PHOS 2.9 3.4  --  2.8   Liver Function Tests: Recent Labs  Lab 2020-04-02 1541  AST 22  ALT 13  ALKPHOS 84  BILITOT 1.5*  PROT 6.8  ALBUMIN 3.6   No results for input(s): LIPASE, AMYLASE in the last 168 hours. No results for input(s): AMMONIA in the last 168 hours. CBC: Recent Labs  Lab 04/02/2020 1541 03/06/20 0504 03/08/20 0548  WBC 13.6* 19.2* 18.1*  NEUTROABS 11.7*  --  15.6*  HGB 15.7* 16.2* 14.2  HCT 46.2* 47.1* 41.7  MCV 98.7 97.9 99.8  PLT 207 186 162   Cardiac Enzymes: No results for input(s): CKTOTAL, CKMB, CKMBINDEX, TROPONINI in the last 168 hours. Sepsis Labs: Recent Labs  Lab 2020-04-02 1541 04-02-20 1731 02-Apr-2020 2239 03/06/20 0504 03/08/20 0548  PROCALCITON <0.10  --   --   --   --   WBC 13.6*  --   --  19.2* 18.1*  LATICACIDVEN 2.9* 2.6* 3.4*  --   --     Procedures/Operations  Mechanical Intubation  03/10/20, AGNP  Pulmonary/Critical Care Pager [phone] (please enter 7 digits) PCCM  Consult Pager [phone] (please enter 7 digits)

## 2020-03-16 DEATH — deceased

## 2020-03-22 LAB — BLOOD GAS, ARTERIAL
Acid-base deficit: 4.1 mmol/L — ABNORMAL HIGH (ref 0.0–2.0)
Bicarbonate: 21.6 mmol/L (ref 20.0–28.0)
FIO2: 40
MECHVT: 450 mL
Mechanical Rate: 16
O2 Saturation: 97.8 %
PEEP: 5 cmH2O
Patient temperature: 37
pCO2 arterial: 41 mmHg (ref 32.0–48.0)
pH, Arterial: 7.33 — ABNORMAL LOW (ref 7.350–7.450)
pO2, Arterial: 107 mmHg (ref 83.0–108.0)

## 2021-05-15 IMAGING — DX DG ABDOMEN 1V
1 series · 1 of 1 positions shown · non-contrast
Comparison: Earlier radiograph dated 03/05/2020.

CLINICAL DATA: 78-year-old female status post intubation.

EXAM:
CHEST  1 VIEW; ABDOMEN - 1 VIEW

[abdomen supine]
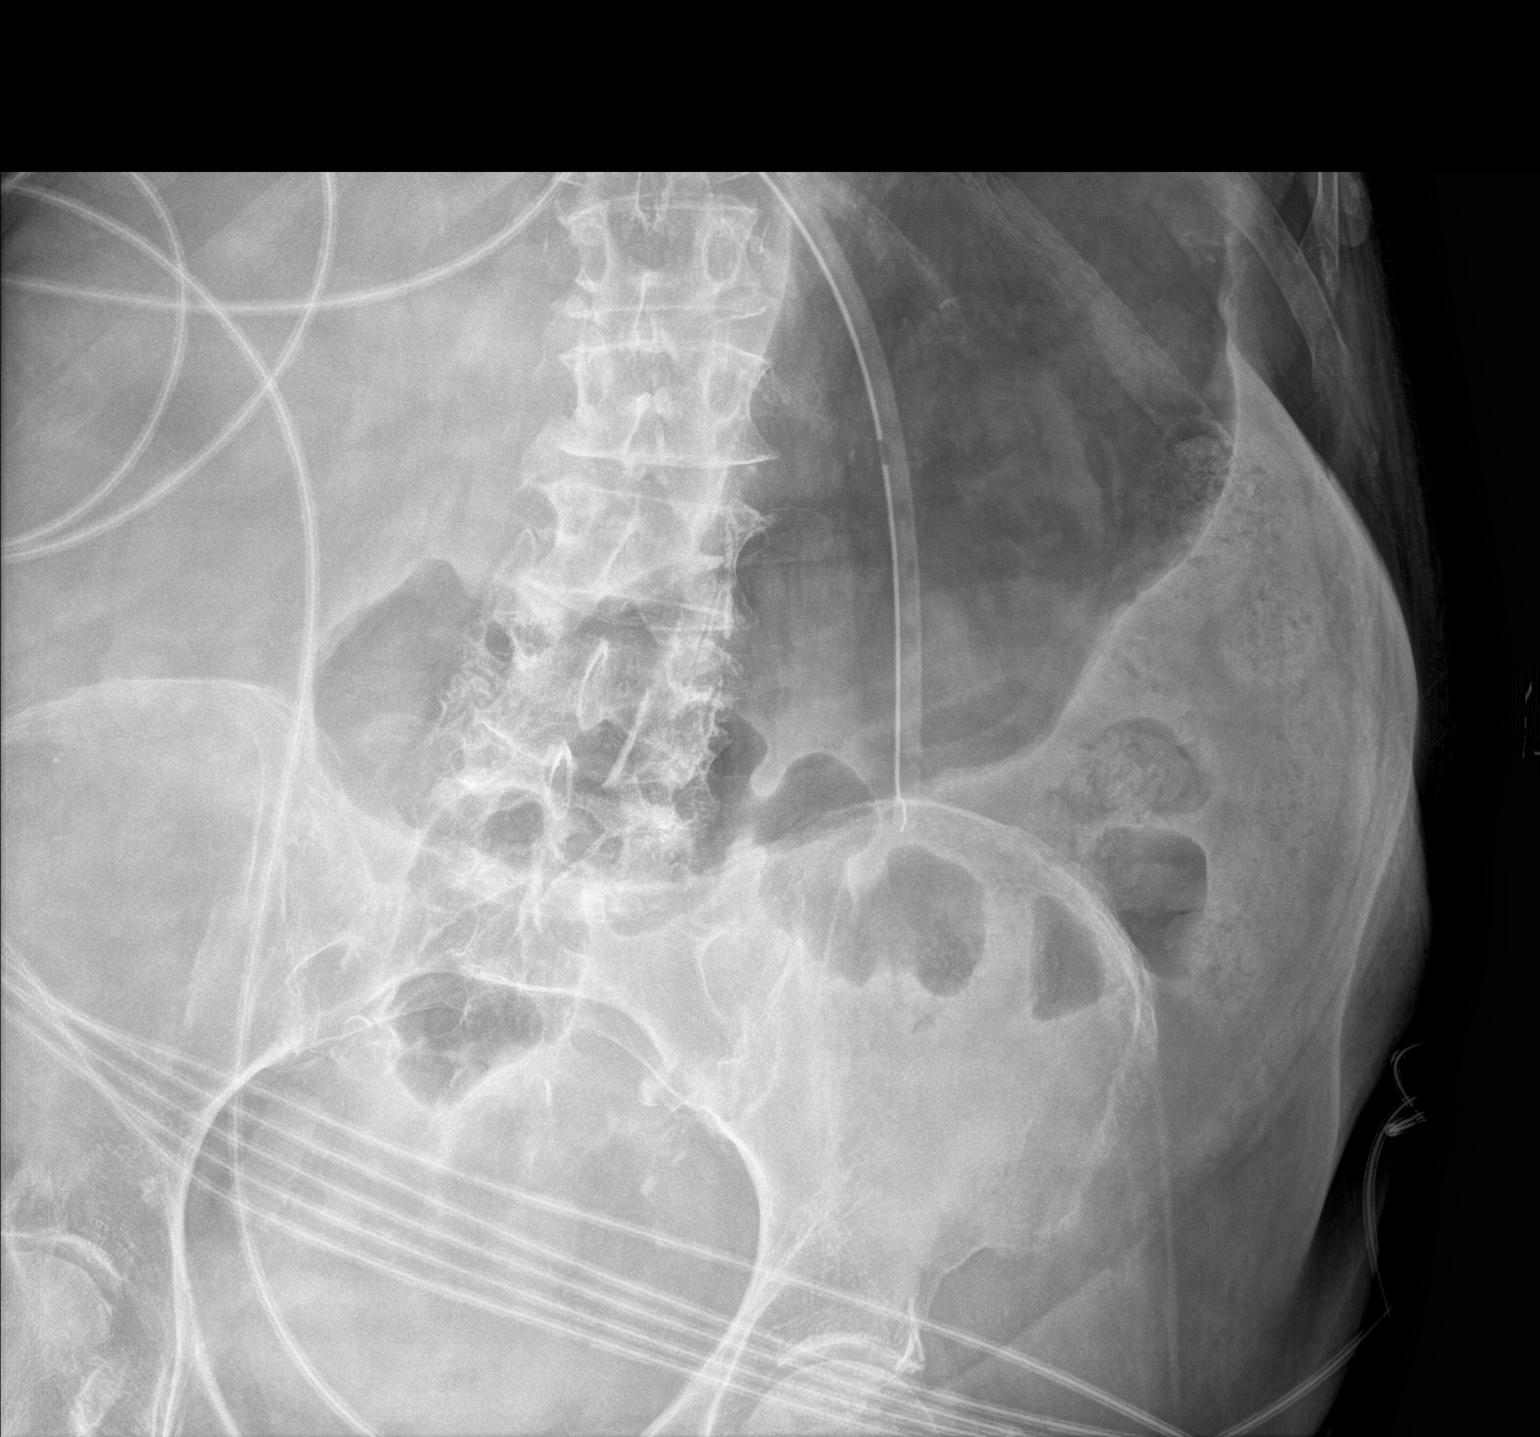

[1 of 1 positions shown; findings below may reference images not displayed]

FINDINGS: Endotracheal tube with tip approximately 3 cm above the carina.
Enteric tube extends below the diaphragm with tip in the body of the
stomach.

Diffuse interstitial prominence with Kerley B-lines consistent with
edema. Interval worsening of the airspace opacity involving the
right mid to lower lung field which may represent worsening edema
although pneumonia is not excluded. No large pleural effusion or
pneumothorax. Mild cardiomegaly. Atherosclerotic calcification of
the aorta.

There is gaseous distention of the stomach. Degenerative changes of
the spine. No acute osseous pathology.
IMPRESSION: 1. Endotracheal tube above the carina. Enteric tube extends below
the diaphragm with tip in the body of the stomach.
2. Interval worsening of airspace density in the right lung.

## 2021-05-15 IMAGING — DX DG CHEST 1V PORT
1 series · 1 of 1 positions shown · non-contrast
Comparison: Chest radiograph dated 01/21/2019

CLINICAL DATA: Hypoxia

EXAM:
PORTABLE CHEST 1 VIEW

[chest ap]
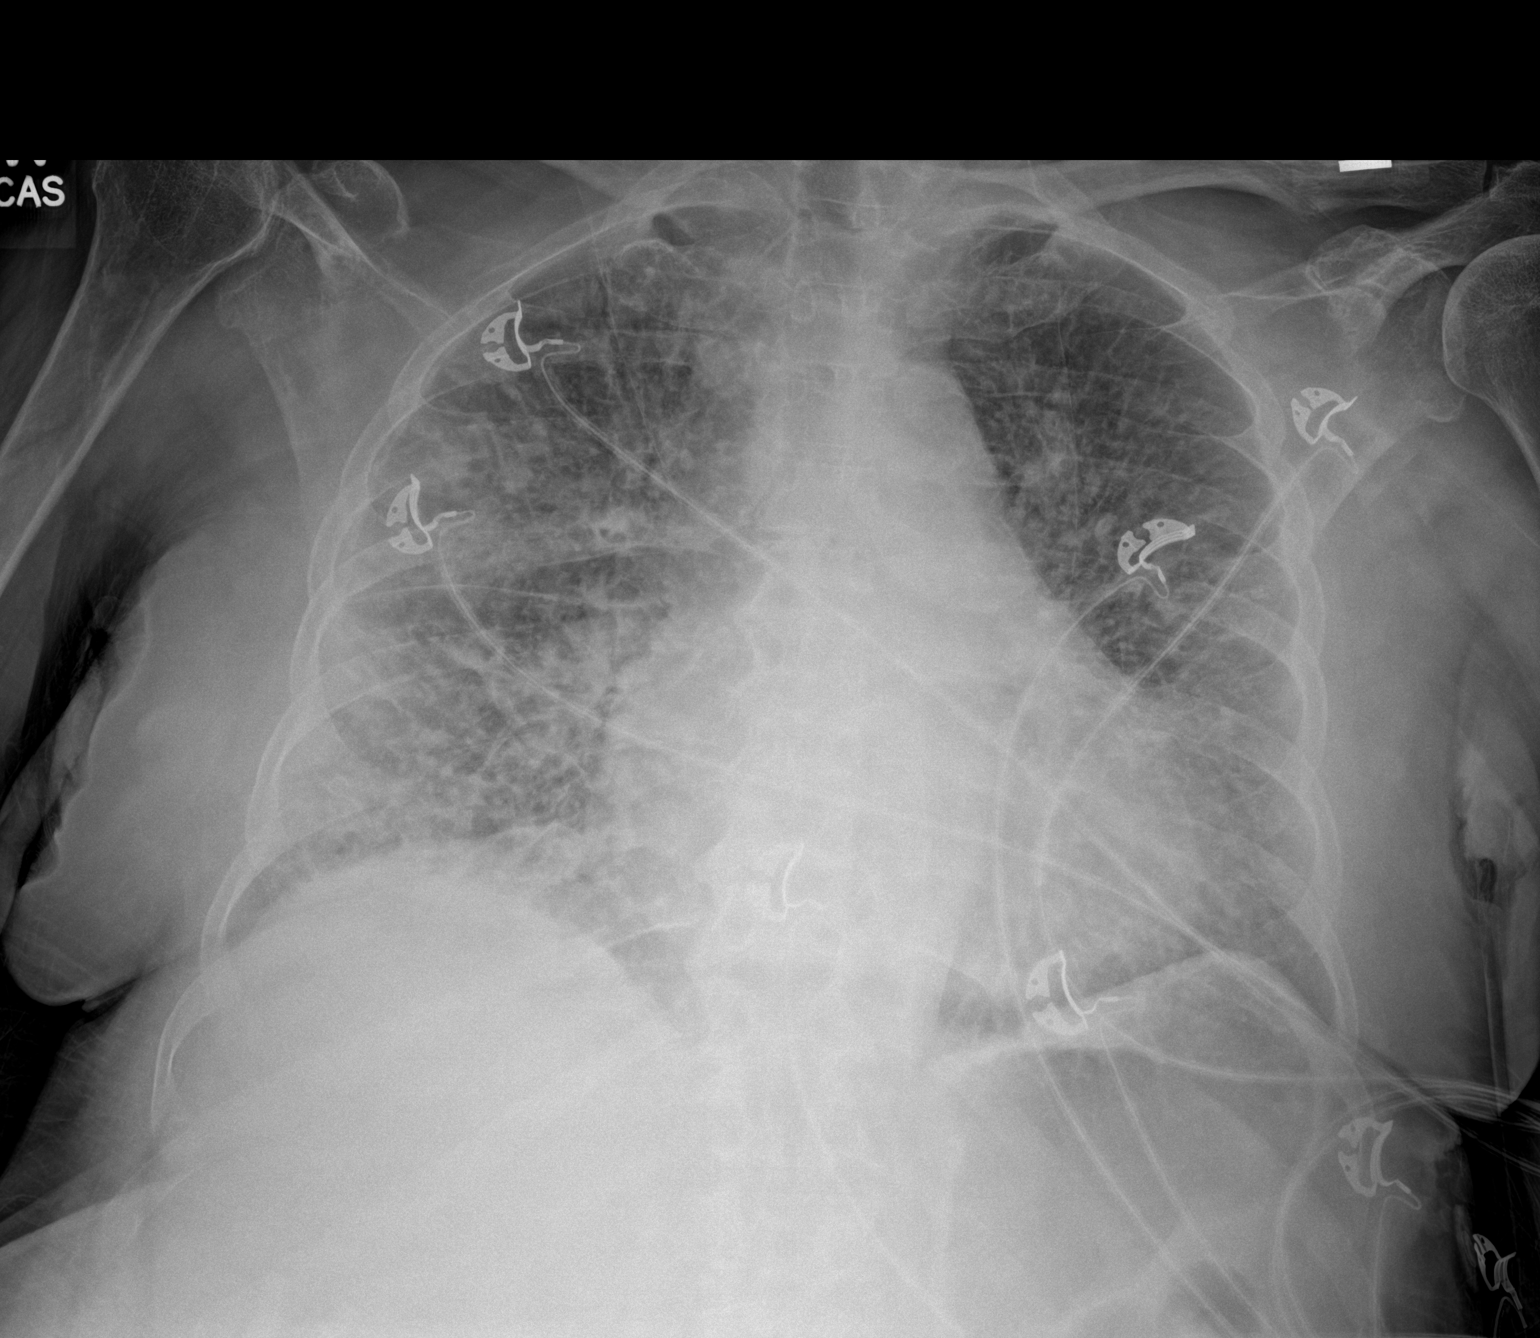

[1 of 1 positions shown; findings below may reference images not displayed]

FINDINGS: The heart is borderline enlarged accounting for technique. Moderate
to severe diffuse bilateral interstitial and airspace opacities are
noted. There is no pleural effusion or pneumothorax. Degenerative
changes are seen in the right shoulder and spine.
IMPRESSION: Moderate to severe diffuse bilateral interstitial and airspace
opacities, likely representing pulmonary edema versus multifocal
pneumonia.
